# Patient Record
Sex: Female | Born: 1975 | Race: Black or African American | Hispanic: No | Marital: Single | Smoking: Never smoker
Health system: Southern US, Community
[De-identification: ages and names within clinical notes are randomized; demographics above are authoritative.]

## PROBLEM LIST (undated history)

## (undated) DIAGNOSIS — K219 Gastro-esophageal reflux disease without esophagitis: Secondary | ICD-10-CM

## (undated) HISTORY — PX: CHOLECYSTECTOMY: SHX55

## (undated) HISTORY — PX: APPENDECTOMY: SHX54

## (undated) HISTORY — PX: TUBAL LIGATION: SHX77

---

## 2006-04-16 ENCOUNTER — Emergency Department: Payer: Self-pay | Admitting: Emergency Medicine

## 2006-07-22 ENCOUNTER — Emergency Department: Payer: Self-pay | Admitting: Unknown Physician Specialty

## 2007-01-02 ENCOUNTER — Emergency Department: Payer: Self-pay | Admitting: Emergency Medicine

## 2008-10-14 ENCOUNTER — Emergency Department: Payer: Self-pay | Admitting: Emergency Medicine

## 2009-11-13 ENCOUNTER — Emergency Department: Payer: Self-pay | Admitting: Unknown Physician Specialty

## 2009-11-14 ENCOUNTER — Inpatient Hospital Stay: Payer: Self-pay | Admitting: Vascular Surgery

## 2010-09-14 ENCOUNTER — Emergency Department: Payer: Self-pay | Admitting: Emergency Medicine

## 2010-09-27 ENCOUNTER — Emergency Department: Payer: Self-pay | Admitting: Emergency Medicine

## 2011-10-05 ENCOUNTER — Emergency Department: Payer: Self-pay | Admitting: Emergency Medicine

## 2011-12-31 ENCOUNTER — Emergency Department: Payer: Self-pay | Admitting: Unknown Physician Specialty

## 2013-09-22 ENCOUNTER — Emergency Department: Payer: Self-pay | Admitting: Emergency Medicine

## 2013-09-22 LAB — CBC
HCT: 34.2 % — ABNORMAL LOW (ref 35.0–47.0)
HGB: 11.1 g/dL — ABNORMAL LOW (ref 12.0–16.0)
MCH: 26.2 pg (ref 26.0–34.0)
MCHC: 32.6 g/dL (ref 32.0–36.0)
MCV: 80 fL (ref 80–100)
RBC: 4.26 10*6/uL (ref 3.80–5.20)
RDW: 14.5 % (ref 11.5–14.5)
WBC: 7.4 10*3/uL (ref 3.6–11.0)

## 2013-09-22 LAB — COMPREHENSIVE METABOLIC PANEL
Albumin: 3.5 g/dL (ref 3.4–5.0)
Anion Gap: 3 — ABNORMAL LOW (ref 7–16)
BUN: 9 mg/dL (ref 7–18)
Bilirubin,Total: 0.3 mg/dL (ref 0.2–1.0)
Calcium, Total: 9 mg/dL (ref 8.5–10.1)
Co2: 30 mmol/L (ref 21–32)
Creatinine: 0.9 mg/dL (ref 0.60–1.30)
EGFR (African American): >60
Osmolality: 275 (ref 275–301)
SGPT (ALT): 24 U/L (ref 12–78)
Total Protein: 7.6 g/dL (ref 6.4–8.2)

## 2013-09-22 LAB — CK TOTAL AND CKMB (NOT AT ARMC)
CK, Total: 62 U/L (ref 21–215)
CK-MB: <0.5 ng/mL — ABNORMAL LOW (ref 0.5–3.6)

## 2013-09-22 LAB — URINALYSIS, COMPLETE
Bilirubin,UR: NEGATIVE
Glucose,UR: NEGATIVE mg/dL (ref 0–75)
Ketone: NEGATIVE
Specific Gravity: 1.023 (ref 1.003–1.030)

## 2013-09-23 LAB — TROPONIN I: Troponin-I: <0.02 ng/mL

## 2013-09-24 LAB — URINE CULTURE

## 2015-02-24 ENCOUNTER — Emergency Department: Payer: Self-pay | Admitting: Emergency Medicine

## 2016-05-30 ENCOUNTER — Emergency Department: Payer: Self-pay

## 2016-05-30 ENCOUNTER — Emergency Department
Admission: EM | Admit: 2016-05-30 | Discharge: 2016-05-30 | Disposition: A | Discharge status: Home / Self Care | Payer: Self-pay | Attending: Emergency Medicine | Admitting: Emergency Medicine

## 2016-05-30 ENCOUNTER — Encounter: Payer: Self-pay | Admitting: Emergency Medicine

## 2016-05-30 DIAGNOSIS — F4320 Adjustment disorder, unspecified: Secondary | ICD-10-CM | POA: Insufficient documentation

## 2016-05-30 DIAGNOSIS — F4321 Adjustment disorder with depressed mood: Secondary | ICD-10-CM

## 2016-05-30 LAB — CBC
HCT: 31.1 % — ABNORMAL LOW (ref 35.0–47.0)
HEMOGLOBIN: 9.6 g/dL — AB (ref 12.0–16.0)
MCH: 21.9 pg — AB (ref 26.0–34.0)
MCHC: 30.9 g/dL — AB (ref 32.0–36.0)
MCV: 70.9 fL — AB (ref 80.0–100.0)
PLATELETS: 357 10*3/uL (ref 150–440)
RBC: 4.4 MIL/uL (ref 3.80–5.20)
RDW: 20.5 % — ABNORMAL HIGH (ref 11.5–14.5)
WBC: 9.2 10*3/uL (ref 3.6–11.0)

## 2016-05-30 LAB — BASIC METABOLIC PANEL
Anion gap: 8 (ref 5–15)
BUN: 8 mg/dL (ref 6–20)
CALCIUM: 8.9 mg/dL (ref 8.9–10.3)
CHLORIDE: 104 mmol/L (ref 101–111)
CO2: 25 mmol/L (ref 22–32)
CREATININE: 0.76 mg/dL (ref 0.44–1.00)
GFR calc Af Amer: >60 mL/min (ref >60)
GFR calc non Af Amer: >60 mL/min (ref >60)
GLUCOSE: 119 mg/dL — AB (ref 65–99)
Potassium: 3.5 mmol/L (ref 3.5–5.1)
Sodium: 137 mmol/L (ref 135–145)

## 2016-05-30 LAB — TROPONIN I

## 2016-05-30 MED ORDER — LORAZEPAM 1 MG PO TABS
1.0000 mg | ORAL_TABLET | Freq: Once | ORAL | Status: AC
  Administered 2016-05-30: 1 mg via ORAL

## 2016-05-30 MED ORDER — LORAZEPAM 1 MG PO TABS
ORAL_TABLET | ORAL | Status: AC
  Administered 2016-05-30: 1 mg via ORAL
  Filled 2016-05-30: qty 1

## 2016-05-30 MED ORDER — ALPRAZOLAM 0.5 MG PO TABS: 0.5000 mg | ORAL_TABLET | Freq: Three times a day (TID) | ORAL | Status: AC | PRN

## 2016-05-30 NOTE — ED Notes (Signed)
Patient presents to the ED with right sided chest pain and shortness of breath.  Patient was visiting her father in the hospital who has a poor prognosis.  Patient states chest pain seems to be easing somewhat at this time.  Patient reports still feeling somewhat short of breath.  Patient reports high level of stress the past few months and insomnia.

## 2016-05-30 NOTE — Discharge Instructions (Signed)
  Adjustment Disorder Adjustment disorder is an unusually severe reaction to a stressful life event, such as the loss of a job or physical illness. The event may be any stressful event other than the loss of a loved one. Adjustment disorder may affect your feelings, your thinking, how you act, or a combination of these. It may interfere with personal relationships or with the way you are at work, school, or home. People with this disorder are at risk for suicide and substance abuse. They may develop a more serious mental disorder, such as major depressive disorder or post-traumatic stress disorder. SIGNS AND SYMPTOMS  Symptoms may include:  Sadness, depressed mood, or crying spells.  Loss of enjoyment.  Change in appetite or weight.  Sense of loss or hopelessness.  Thoughts of suicide.  Anxiety, worry, or nervousness.  Trouble sleeping.  Avoiding family and friends.  Poor school performance.  Fighting or vandalism.  Reckless driving.  Skipping school.  Poor work performance.  Ignoring bills. Symptoms of adjustment disorder start within 3 months of the stressful life event. They do not last more than 6 months after the event has ended. DIAGNOSIS  To make a diagnosis, your health care provider will ask about what has happened in your life and how it has affected you. He or she may also ask about your medical history and use of medicines, alcohol, and other substances. Your health care provider may do a physical exam and order lab tests or other studies. You may be referred to a mental health specialist for evaluation. TREATMENT  Treatment options include:  Counseling or talk therapy. Talk therapy is usually provided by mental health specialists.  Medicine. Certain medicines may help with depression, anxiety, and sleep.  Support groups. Support groups offer emotional support, advice, and guidance. They are made up of people who have had similar experiences. HOME CARE  INSTRUCTIONS  Keep all follow-up visits as directed by your health care provider. This is important.  Take medicines only as directed by your health care provider. SEEK MEDICAL CARE IF:  Your symptoms get worse.  SEEK IMMEDIATE MEDICAL CARE IF: You have serious thoughts about hurting yourself or someone else. MAKE SURE YOU:  Understand these instructions.  Will watch your condition.  Will get help right away if you are not doing well or get worse.   This information is not intended to replace advice given to you by your health care provider. Make sure you discuss any questions you have with your health care provider.   Document Released: 08/15/2006 Document Revised: 01/01/2015 Document Reviewed: 05/05/2014 Elsevier Interactive Patient Education 2016 [employer]  

## 2016-05-30 NOTE — ED Provider Notes (Signed)
[facility] Emergency Department Provider Note  ____________________________________________   I have reviewed the triage vital signs and the nursing notes.   HISTORY  Chief Complaint Chest Pain    HPI Michele Schneider is a 40 y.o. female whose father unfortunately is the ICU after prolonged emergency room stay requiring CPR 5 times. Patient was very anxious and immediately after hearing this news she became very upset. She states that she does not feel she is having any sort of medical problem today she feels that she was just anxious she would like to leave for her nerves. She denies any ongoing symptoms. She denies actually having chest pain she states "I was just overwhelmed". She denies any HI or SI, she does not feel that she is a danger to self or others. Patient has had no pleuritic pain no cough and at this time she denies any shortness of breath that she calms down. This all happened immediately after hearing the news of her father's critical status. She has no known risk factors she tells me for PE or DVT, no pleuritic chest pain, she denies any other medical problems.   History reviewed. No pertinent past medical history.  There are no active problems to display for this patient.   History reviewed. No pertinent past surgical history.  No current outpatient prescriptions on file.  Allergies Review of patient's allergies indicates no known allergies.  No family history on file.  Social History Social History  Substance Use Topics  . Smoking status: Never Smoker   . Smokeless tobacco: None  . Alcohol Use: Yes     Comment: social    Review of Systems Constitutional: No fever/chills Eyes: No visual changes. ENT: No sore throat. No stiff neck no neck pain Cardiovascular:See history of present illness regarding pain. Respiratory: Denies shortness of breath. Gastrointestinal:   no vomiting.  No diarrhea.  No constipation. Genitourinary:  Negative for dysuria. Musculoskeletal: Negative lower extremity swelling Skin: Negative for rash. Neurological: Negative for headaches, focal weakness or numbness. 10-point ROS otherwise negative.  ____________________________________________   PHYSICAL EXAM:  VITAL SIGNS: ED Triage Vitals  Enc Vitals Group     BP 05/30/16 1154 148/83 mmHg     Pulse Rate 05/30/16 1154 90     Resp 05/30/16 1154 16     Temp 05/30/16 1154 98.8 F (37.1 C)     Temp Source 05/30/16 1154 Oral     SpO2 05/30/16 1154 100 %     Weight 05/30/16 1154 270 lb (122.471 kg)     Height 05/30/16 1154 5\' 5"  (1.651 m)     Head Cir --      Peak Flow --      Pain Score 05/30/16 1154 0     Pain Loc --      Pain Edu? --      Excl. in GC? --     Constitutional: Alert and oriented. Well appearing and in no acute distress.Somewhat tearful but otherwise in no acute distress Eyes: Conjunctivae are normal. PERRL. EOMI. Head: Atraumatic. Nose: No congestion/rhinnorhea. Mouth/Throat: Mucous membranes are moist.  Oropharynx non-erythematous. Neck: No stridor.   Nontender with no meningismus Cardiovascular: Normal rate, regular rhythm. Grossly normal heart sounds.  Good peripheral circulation. Respiratory: Normal respiratory effort.  No retractions. Lungs CTAB. Abdominal: Soft and nontender. No distention. No guarding no rebound Back:  There is no focal tenderness or step off there is no midline tenderness there are no lesions noted. there is no  CVA tenderness Musculoskeletal: No lower extremity tenderness. No joint effusions, no DVT signs strong distal pulses no edema Neurologic:  Normal speech and language. No gross focal neurologic deficits are appreciated.  Skin:  Skin is warm, dry and intact. No rash noted. Psychiatric: Mood and affect are consistent with grief reaction. Speech and behavior are normal.  ____________________________________________   LABS (all labs ordered are listed, but only abnormal results  are displayed)  Labs Reviewed  BASIC METABOLIC PANEL - Abnormal; Notable for the following:    Glucose, Bld 119 (*)    All other components within normal limits  CBC - Abnormal; Notable for the following:    Hemoglobin 9.6 (*)    HCT 31.1 (*)    MCV 70.9 (*)    MCH 21.9 (*)    MCHC 30.9 (*)    RDW 20.5 (*)    All other components within normal limits  TROPONIN I   ____________________________________________  EKG  I personally interpreted any EKGs ordered by me or triage Normal sinus rhythm at 83 beats per an acute ST elevation or acute ST depression normal axis unremarkable EKG ____________________________________________  RADIOLOGY  I reviewed any imaging ordered by me or triage that were performed during my shift and, if possible, patient and/or family made aware of any abnormal findings. ____________________________________________   PROCEDURES  Procedure(s) performed: None  Critical Care performed: None  ____________________________________________   INITIAL IMPRESSION / ASSESSMENT AND PLAN / ED COURSE  Pertinent labs & imaging results that were available during my care of the patient were reviewed by me and considered in my medical decision making (see chart for details).  Patient at this time has no symptoms and no complaints. She is refusing further care she would like to go to the ICU to be with her father. She understands limitation this placed upon me in terms of workup. Specifically I cannot do serial cardiac enzymes however again I have low suspicion. At this time there is no evidence of PE or ACS or dissection. Given the patient is refusing further care, and has a story very inconsistent with any significant pathology we will discharge her at her request. She understands she must return to the emergency room for any new or worrisome symptoms. She is made aware of all the findings we have had here including her relative anemia which she states is chronic. We  will have her follow-up as an outpatient with her primary care doctor. There is no evidence of takusubo.   ____________________________________________   FINAL CLINICAL IMPRESSION(S) / ED DIAGNOSES  Final diagnoses:  None      This chart was dictated using voice recognition software.  Despite best efforts to proofread,  errors can occur which can change meaning.     Jeanmarie Plant, MD 05/30/16 1321

## 2017-03-12 DIAGNOSIS — Y92410 Unspecified street and highway as the place of occurrence of the external cause: Secondary | ICD-10-CM | POA: Insufficient documentation

## 2017-03-12 DIAGNOSIS — S8991XA Unspecified injury of right lower leg, initial encounter: Secondary | ICD-10-CM | POA: Diagnosis present

## 2017-03-12 DIAGNOSIS — Z5321 Procedure and treatment not carried out due to patient leaving prior to being seen by health care provider: Secondary | ICD-10-CM | POA: Insufficient documentation

## 2017-03-12 DIAGNOSIS — M79661 Pain in right lower leg: Secondary | ICD-10-CM | POA: Diagnosis not present

## 2017-03-12 DIAGNOSIS — Y999 Unspecified external cause status: Secondary | ICD-10-CM | POA: Insufficient documentation

## 2017-03-12 DIAGNOSIS — Y939 Activity, unspecified: Secondary | ICD-10-CM | POA: Insufficient documentation

## 2017-03-12 DIAGNOSIS — M542 Cervicalgia: Secondary | ICD-10-CM | POA: Diagnosis not present
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
## 2017-03-13 ENCOUNTER — Emergency Department
Admission: EM | Admit: 2017-03-13 | Discharge: 2017-03-13 | Disposition: A | Discharge status: Home / Self Care | Payer: No Typology Code available for payment source | Attending: Emergency Medicine | Admitting: Emergency Medicine

## 2018-09-10 ENCOUNTER — Other Ambulatory Visit: Payer: Self-pay

## 2018-09-10 ENCOUNTER — Emergency Department
Admission: EM | Admit: 2018-09-10 | Discharge: 2018-09-10 | Disposition: A | Discharge status: Home / Self Care | Payer: Medicaid Other | Attending: Student in an Organized Health Care Education/Training Program | Admitting: Student in an Organized Health Care Education/Training Program

## 2018-09-10 ENCOUNTER — Emergency Department: Payer: Medicaid Other

## 2018-09-10 ENCOUNTER — Encounter: Payer: Self-pay | Admitting: Radiology

## 2018-09-10 DIAGNOSIS — K5732 Diverticulitis of large intestine without perforation or abscess without bleeding: Secondary | ICD-10-CM | POA: Diagnosis not present

## 2018-09-10 DIAGNOSIS — R1084 Generalized abdominal pain: Secondary | ICD-10-CM | POA: Diagnosis present

## 2018-09-10 LAB — CBC
HCT: 36 % (ref 35.0–47.0)
HEMOGLOBIN: 11.6 g/dL — AB (ref 12.0–16.0)
MCH: 25.2 pg — ABNORMAL LOW (ref 26.0–34.0)
MCHC: 32.2 g/dL (ref 32.0–36.0)
MCV: 78.3 fL — AB (ref 80.0–100.0)
PLATELETS: 322 10*3/uL (ref 150–440)
RBC: 4.59 MIL/uL (ref 3.80–5.20)
RDW: 16.8 % — ABNORMAL HIGH (ref 11.5–14.5)
WBC: 9 10*3/uL (ref 3.6–11.0)

## 2018-09-10 LAB — COMPREHENSIVE METABOLIC PANEL
ALT: 18 U/L (ref 0–44)
AST: 19 U/L (ref 15–41)
Albumin: 3.9 g/dL (ref 3.5–5.0)
Alkaline Phosphatase: 93 U/L (ref 38–126)
Anion gap: 6 (ref 5–15)
BUN: 9 mg/dL (ref 6–20)
CHLORIDE: 105 mmol/L (ref 98–111)
CO2: 27 mmol/L (ref 22–32)
CREATININE: 0.85 mg/dL (ref 0.44–1.00)
Calcium: 8.6 mg/dL — ABNORMAL LOW (ref 8.9–10.3)
GFR calc Af Amer: >60 mL/min (ref >60)
Glucose, Bld: 120 mg/dL — ABNORMAL HIGH (ref 70–99)
Potassium: 3.9 mmol/L (ref 3.5–5.1)
Sodium: 138 mmol/L (ref 135–145)
Total Bilirubin: 0.6 mg/dL (ref 0.3–1.2)
Total Protein: 7.3 g/dL (ref 6.5–8.1)

## 2018-09-10 LAB — LIPASE, BLOOD: Lipase: 26 U/L (ref 11–51)

## 2018-09-10 MED ORDER — HYDROCODONE-ACETAMINOPHEN 5-325 MG PO TABS
1.0000 | ORAL_TABLET | Freq: Once | ORAL | Status: AC
  Administered 2018-09-10: 1 via ORAL
  Filled 2018-09-10: qty 1

## 2018-09-10 MED ORDER — METRONIDAZOLE 500 MG PO TABS
500.0000 mg | ORAL_TABLET | Freq: Once | ORAL | Status: AC
  Administered 2018-09-10: 500 mg via ORAL
  Filled 2018-09-10: qty 1

## 2018-09-10 MED ORDER — HYDROCODONE-ACETAMINOPHEN 5-325 MG PO TABS: 1.0000 | ORAL_TABLET | ORAL | 0 refills | Status: DC | PRN

## 2018-09-10 MED ORDER — IOPAMIDOL (ISOVUE-300) INJECTION 61%
125.0000 mL | Freq: Once | INTRAVENOUS | Status: AC | PRN
  Administered 2018-09-10: 125 mL via INTRAVENOUS

## 2018-09-10 MED ORDER — PROMETHAZINE HCL 25 MG/ML IJ SOLN: 12.5000 mg | Freq: Four times a day (QID) | INTRAMUSCULAR | Status: DC | PRN

## 2018-09-10 MED ORDER — CIPROFLOXACIN HCL 500 MG PO TABS
500.0000 mg | ORAL_TABLET | Freq: Once | ORAL | Status: AC
  Administered 2018-09-10: 500 mg via ORAL
  Filled 2018-09-10: qty 1

## 2018-09-10 MED ORDER — MORPHINE SULFATE (PF) 4 MG/ML IV SOLN
4.0000 mg | INTRAVENOUS | Status: DC | PRN
  Administered 2018-09-10: 4 mg via INTRAVENOUS
  Filled 2018-09-10: qty 1

## 2018-09-10 MED ORDER — ONDANSETRON HCL 4 MG PO TABS: 4.0000 mg | ORAL_TABLET | Freq: Every day | ORAL | 0 refills | Status: AC | PRN

## 2018-09-10 MED ORDER — SODIUM CHLORIDE 0.9 % IV BOLUS
1000.0000 mL | Freq: Once | INTRAVENOUS | Status: AC
  Administered 2018-09-10: 1000 mL via INTRAVENOUS

## 2018-09-10 MED ORDER — METRONIDAZOLE 500 MG PO TABS: 500.0000 mg | ORAL_TABLET | Freq: Three times a day (TID) | ORAL | 0 refills | Status: AC

## 2018-09-10 MED ORDER — CIPROFLOXACIN HCL 500 MG PO TABS: 500.0000 mg | ORAL_TABLET | Freq: Two times a day (BID) | ORAL | 0 refills | Status: AC

## 2018-09-10 NOTE — Discharge Instructions (Addendum)

## 2018-09-10 NOTE — ED Notes (Signed)
Pt went to CT

## 2018-09-10 NOTE — ED Triage Notes (Signed)
Patient reports having mid lower abdominal pain for the past 2 days.  Denies nausea or vomiting.

## 2018-09-10 NOTE — ED Provider Notes (Signed)
[facility] Emergency Department Provider Note    First MD Initiated Contact with Patient 09/10/18 0109     (approximate)  I have reviewed the triage vital signs and the nursing notes.   HISTORY  Chief Complaint Abdominal Pain    HPI Michele Schneider is a 42 y.o. female presents to the ER with chief complaint of mid left-sided lower abdominal pain over the past 2 days.  No nausea or vomiting but did have watery diarrhea today.  Is never had pain like this before.  She status post appendectomy as well as cholecystectomy.  Denies any chest pain or shortness of breath.  Pain she describes as mild to moderate in severity.    History reviewed. No pertinent past medical history. No family history on file. No past surgical history on file. There are no active problems to display for this patient.     Prior to Admission medications   Medication Sig Start Date End Date Taking? Authorizing Provider  ciprofloxacin (CIPRO) 500 MG tablet Take 1 tablet (500 mg total) by mouth 2 (two) times daily for 7 days. 09/10/18 09/17/18  Willy Eddy, MD  HYDROcodone-acetaminophen (NORCO) 5-325 MG tablet Take 1 tablet by mouth every 4 (four) hours as needed for moderate pain. 09/10/18   Willy Eddy, MD  metroNIDAZOLE (FLAGYL) 500 MG tablet Take 1 tablet (500 mg total) by mouth 3 (three) times daily for 7 days. 09/10/18 09/17/18  Willy Eddy, MD  ondansetron [facility]) 4 MG tablet Take 1 tablet (4 mg total) by mouth daily as needed for nausea or vomiting. 09/10/18 09/10/19  Willy Eddy, MD    Allergies Penicillins    Social History Social History   Tobacco Use  . Smoking status: Never Smoker  Substance Use Topics  . Alcohol use: Yes    Comment: social  . Drug use: Not on file    Review of Systems Patient denies headaches, rhinorrhea, blurry vision, numbness, shortness of breath, chest pain, edema, cough, abdominal pain, nausea, vomiting, diarrhea,  dysuria, fevers, rashes or hallucinations unless otherwise stated above in HPI. ____________________________________________   PHYSICAL EXAM:  VITAL SIGNS: Vitals:   09/10/18 0300 09/10/18 0538  BP: 134/79 125/72  Pulse: 83 79  Resp: 18 16  Temp:    SpO2: 97% 98%    Constitutional: Alert and oriented.  Eyes: Conjunctivae are normal.  Head: Atraumatic. Nose: No congestion/rhinnorhea. Mouth/Throat: Mucous membranes are moist.   Neck: No stridor. Painless ROM.  Cardiovascular: Normal rate, regular rhythm. Grossly normal heart sounds.  Good peripheral circulation. Respiratory: Normal respiratory effort.  No retractions. Lungs CTAB. Gastrointestinal: Soft ttp in llq No distention. No abdominal bruits. No CVA tenderness. Genitourinary:  Musculoskeletal: No lower extremity tenderness nor edema.  No joint effusions. Neurologic:  Normal speech and language. No gross focal neurologic deficits are appreciated. No facial droop Skin:  Skin is warm, dry and intact. No rash noted. Psychiatric: Mood and affect are normal. Speech and behavior are normal.  ____________________________________________   LABS (all labs ordered are listed, but only abnormal results are displayed)  Results for orders placed or performed during the hospital encounter of 09/10/18 (from the past 24 hour(s))  CBC     Status: Abnormal   Collection Time: 09/10/18  1:15 AM  Result Value Ref Range   WBC 9.0 3.6 - 11.0 K/uL   RBC 4.59 3.80 - 5.20 MIL/uL   Hemoglobin 11.6 (L) 12.0 - 16.0 g/dL   HCT 16.1 09.6 - 04.5 %  MCV 78.3 (L) 80.0 - 100.0 fL   MCH 25.2 (L) 26.0 - 34.0 pg   MCHC 32.2 32.0 - 36.0 g/dL   RDW 11.9 (H) 14.7 - 82.9 %   Platelets 322 150 - 440 K/uL  Comprehensive metabolic panel     Status: Abnormal   Collection Time: 09/10/18  1:47 AM  Result Value Ref Range   Sodium 138 135 - 145 mmol/L   Potassium 3.9 3.5 - 5.1 mmol/L   Chloride 105 98 - 111 mmol/L   CO2 27 22 - 32 mmol/L   Glucose, Bld 120  (H) 70 - 99 mg/dL   BUN 9 6 - 20 mg/dL   Creatinine, Ser 5.62 0.44 - 1.00 mg/dL   Calcium 8.6 (L) 8.9 - 10.3 mg/dL   Total Protein 7.3 6.5 - 8.1 g/dL   Albumin 3.9 3.5 - 5.0 g/dL   AST 19 15 - 41 U/L   ALT 18 0 - 44 U/L   Alkaline Phosphatase 93 38 - 126 U/L   Total Bilirubin 0.6 0.3 - 1.2 mg/dL   GFR calc non Af Amer >60 >60 mL/min   GFR calc Af Amer >60 >60 mL/min   Anion gap 6 5 - 15  Lipase, blood     Status: None   Collection Time: 09/10/18  1:47 AM  Result Value Ref Range   Lipase 26 11 - 51 U/L   ____________________________________________ ____________________________________________  RADIOLOGY  I personally reviewed all radiographic images ordered to evaluate for the above acute complaints and reviewed radiology reports and findings.  These findings were personally discussed with the patient.  Please see medical record for radiology report.  ____________________________________________   PROCEDURES  Procedure(s) performed:  Procedures    Critical Care performed: no ____________________________________________   INITIAL IMPRESSION / ASSESSMENT AND PLAN / ED COURSE  Pertinent labs & imaging results that were available during my care of the patient were reviewed by me and considered in my medical decision making (see chart for details).   DDX: Diverticulitis, perforation, abscess, musculoskeletal strain, stone, UTI  Michele Schneider is a 42 y.o. who presents to the ED with symptoms as described above.  She is afebrile and hemodynamically stable but given her acuity of symptoms and pain will order CT imaging to further evaluate.  Blood work is fortunately fairly reassuring.  We will give IV fluids as well as IV pain medication and IV antiemetic.  Clinical Course as of Sep 10 646  Tue Sep 10, 2018  0404 Patient does have evidence of acute sigmoid diverticulitis without abscess or perforation.  Will give antibiotics as well as oral pain medication.   [PR]  [phone]  Patient was able to tolerate PO and was able to ambulate with a steady gait.  Have discussed with the patient and available family all diagnostics and treatments performed thus far and all questions were answered to the best of my ability. The patient demonstrates understanding and agreement with plan.     [PR]    Clinical Course User Index [PR] Willy Eddy, MD     As part of my medical decision making, I reviewed the following data within the electronic MEDICAL RECORD NUMBER Nursing notes reviewed and incorporated, Labs reviewed, notes from prior ED visits.   ____________________________________________   FINAL CLINICAL IMPRESSION(S) / ED DIAGNOSES  Final diagnoses:  Diverticulitis of large intestine without perforation or abscess without bleeding      NEW MEDICATIONS STARTED DURING THIS VISIT:  Discharge Medication List as  of 09/10/2018  5:29 AM    START taking these medications   Details  ciprofloxacin (CIPRO) 500 MG tablet Take 1 tablet (500 mg total) by mouth 2 (two) times daily for 7 days., Starting Tue 09/10/2018, Until Tue 09/17/2018, Normal    HYDROcodone-acetaminophen (NORCO) 5-325 MG tablet Take 1 tablet by mouth every 4 (four) hours as needed for moderate pain., Starting Tue 09/10/2018, Normal    metroNIDAZOLE (FLAGYL) 500 MG tablet Take 1 tablet (500 mg total) by mouth 3 (three) times daily for 7 days., Starting Tue 09/10/2018, Until Tue 09/17/2018, Normal    ondansetron (ZOFRAN) 4 MG tablet Take 1 tablet (4 mg total) by mouth daily as needed for nausea or vomiting., Starting Tue 09/10/2018, Until Wed 09/10/2019, Print         Note:  This document was prepared using Dragon voice recognition software and may include unintentional dictation errors.    Willy Eddyobinson, Inanna Telford, MD 09/10/18 [phone]

## 2018-09-19 ENCOUNTER — Emergency Department
Admission: EM | Admit: 2018-09-19 | Discharge: 2018-09-19 | Disposition: A | Discharge status: Home / Self Care | Payer: Medicaid Other | Attending: Emergency Medicine | Admitting: Emergency Medicine

## 2018-09-19 ENCOUNTER — Other Ambulatory Visit: Payer: Self-pay

## 2018-09-19 ENCOUNTER — Emergency Department: Payer: Medicaid Other

## 2018-09-19 ENCOUNTER — Encounter: Payer: Self-pay | Admitting: Emergency Medicine

## 2018-09-19 DIAGNOSIS — Y939 Activity, unspecified: Secondary | ICD-10-CM | POA: Insufficient documentation

## 2018-09-19 DIAGNOSIS — M79604 Pain in right leg: Secondary | ICD-10-CM | POA: Diagnosis present

## 2018-09-19 DIAGNOSIS — M7071 Other bursitis of hip, right hip: Secondary | ICD-10-CM | POA: Diagnosis not present

## 2018-09-19 LAB — COMPREHENSIVE METABOLIC PANEL
ALT: 28 U/L (ref 0–44)
AST: 28 U/L (ref 15–41)
Albumin: 3.9 g/dL (ref 3.5–5.0)
Alkaline Phosphatase: 87 U/L (ref 38–126)
Anion gap: 8 (ref 5–15)
BILIRUBIN TOTAL: 0.7 mg/dL (ref 0.3–1.2)
BUN: 9 mg/dL (ref 6–20)
CO2: 26 mmol/L (ref 22–32)
Calcium: 8.8 mg/dL — ABNORMAL LOW (ref 8.9–10.3)
Chloride: 107 mmol/L (ref 98–111)
Creatinine, Ser: 0.82 mg/dL (ref 0.44–1.00)
GFR calc Af Amer: >60 mL/min (ref >60)
Glucose, Bld: 127 mg/dL — ABNORMAL HIGH (ref 70–99)
POTASSIUM: 3.6 mmol/L (ref 3.5–5.1)
Sodium: 141 mmol/L (ref 135–145)
TOTAL PROTEIN: 7.2 g/dL (ref 6.5–8.1)

## 2018-09-19 LAB — CBC
HEMATOCRIT: 32.4 % — AB (ref 35.0–47.0)
Hemoglobin: 10.8 g/dL — ABNORMAL LOW (ref 12.0–16.0)
MCH: 25.7 pg — ABNORMAL LOW (ref 26.0–34.0)
MCHC: 33.4 g/dL (ref 32.0–36.0)
MCV: 77.1 fL — AB (ref 80.0–100.0)
Platelets: 307 10*3/uL (ref 150–440)
RBC: 4.2 MIL/uL (ref 3.80–5.20)
RDW: 16.5 % — AB (ref 11.5–14.5)
WBC: 10.6 10*3/uL (ref 3.6–11.0)

## 2018-09-19 LAB — SEDIMENTATION RATE: Sed Rate: 33 mm/hr — ABNORMAL HIGH (ref 0–20)

## 2018-09-19 LAB — C-REACTIVE PROTEIN: CRP: 1.9 mg/dL — AB (ref <1.0)

## 2018-09-19 MED ORDER — PREDNISONE 20 MG PO TABS
60.0000 mg | ORAL_TABLET | Freq: Once | ORAL | Status: AC
  Administered 2018-09-19: 60 mg via ORAL
  Filled 2018-09-19: qty 3

## 2018-09-19 MED ORDER — OXYCODONE-ACETAMINOPHEN 5-325 MG PO TABS
2.0000 | ORAL_TABLET | Freq: Once | ORAL | Status: AC
  Administered 2018-09-19: 2 via ORAL
  Filled 2018-09-19: qty 2

## 2018-09-19 MED ORDER — MORPHINE SULFATE (PF) 4 MG/ML IV SOLN
4.0000 mg | Freq: Once | INTRAVENOUS | Status: AC
  Administered 2018-09-19: 4 mg via INTRAVENOUS

## 2018-09-19 MED ORDER — KETOROLAC TROMETHAMINE 30 MG/ML IJ SOLN
INTRAMUSCULAR | Status: AC
  Filled 2018-09-19: qty 1

## 2018-09-19 MED ORDER — ONDANSETRON HCL 4 MG/2ML IJ SOLN
4.0000 mg | Freq: Once | INTRAMUSCULAR | Status: AC
  Administered 2018-09-19: 4 mg via INTRAVENOUS

## 2018-09-19 MED ORDER — KETOROLAC TROMETHAMINE 30 MG/ML IJ SOLN
30.0000 mg | Freq: Once | INTRAMUSCULAR | Status: AC
  Administered 2018-09-19: 30 mg via INTRAVENOUS
  Filled 2018-09-19: qty 1

## 2018-09-19 MED ORDER — PREDNISONE 10 MG (21) PO TBPK: ORAL_TABLET | ORAL | 0 refills | Status: DC

## 2018-09-19 MED ORDER — OXYCODONE-ACETAMINOPHEN 5-325 MG PO TABS: 1.0000 | ORAL_TABLET | Freq: Three times a day (TID) | ORAL | 0 refills | Status: DC | PRN

## 2018-09-19 MED ORDER — ONDANSETRON HCL 4 MG/2ML IJ SOLN
INTRAMUSCULAR | Status: AC
  Administered 2018-09-19: 4 mg via INTRAVENOUS
  Filled 2018-09-19: qty 2

## 2018-09-19 MED ORDER — MORPHINE SULFATE (PF) 4 MG/ML IV SOLN
INTRAVENOUS | Status: AC
  Filled 2018-09-19: qty 1

## 2018-09-19 NOTE — ED Notes (Signed)
PT not in room when this RN went to give Toradol

## 2018-09-19 NOTE — ED Triage Notes (Signed)
Pt to triage via w/c with no distress noted, brought in by EMS; pt reports pain to right hip/thigh since yesterday with no known injury; denies hx of same

## 2018-09-19 NOTE — ED Provider Notes (Signed)
IMPRESSION: Inflammation at the tensor fascia lata/proximal IT band at the level of the greater trochanter, likely with early bursitis.  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  Right thigh venous varicosities.  MRI with bursitis or a superficial cause for her pain.  No signs of septic arthritis or osteomyelitis.  She will be discharged with pain medicine and steroids with a work note.  She is cleared for outpatient orthopedic follow-up.   Emily Filbert, MD 09/19/18 [phone]

## 2018-09-19 NOTE — ED Notes (Signed)
Pt ambulated to bathroom with slow steady gait, pt c/o severe pain to RT hip with ambulation

## 2018-09-19 NOTE — ED Provider Notes (Signed)
[facility] Emergency Department Provider Note   First MD Initiated Contact with Patient 09/19/18 0421     (approximate)  I have reviewed the triage vital signs and the nursing notes.   HISTORY  Chief Complaint Leg Pain    HPI Michele Schneider is a 42 y.o. female presents to the emergency department with 10 out of 10 nontraumatic right hip pain which patient did started after leaving work [employer].  Patient denies any fever no lower extreme any weakness or numbness.    Past medical history Recent diagnosis of diverticulitis currently taking antibiotics   Surgical history Appendectomy Cholecystectomy There are no active problems to display for this patient.    Prior to Admission medications   Medication Sig Start Date End Date Taking? Authorizing Provider  HYDROcodone-acetaminophen (NORCO) 5-325 MG tablet Take 1 tablet by mouth every 4 (four) hours as needed for moderate pain. 09/10/18   Willy Eddy, MD  ondansetron (ZOFRAN) 4 MG tablet Take 1 tablet (4 mg total) by mouth daily as needed for nausea or vomiting. 09/10/18 09/10/19  Willy Eddy, MD  oxyCODONE-acetaminophen (PERCOCET) 5-325 MG tablet Take 1 tablet by mouth every 8 (eight) hours as needed. 09/19/18   Emily Filbert, MD  predniSONE (STERAPRED UNI-PAK 21 TAB) 10 MG (21) TBPK tablet Dispense taper pack as directed 09/19/18   Emily Filbert, MD    Allergies Penicillins  No family history on file.  Social History Social History   Tobacco Use  . Smoking status: Never Smoker  . Smokeless tobacco: Never Used  Substance Use Topics  . Alcohol use: Yes    Comment: social  . Drug use: Not on file    Review of Systems Constitutional: No fever/chills Eyes: No visual changes. ENT: No sore throat. Cardiovascular: Denies chest pain. Respiratory: Denies shortness of breath. Gastrointestinal: No abdominal pain.  No nausea, no vomiting.  No diarrhea.  No  constipation. Genitourinary: Negative for dysuria. Musculoskeletal: Negative for neck pain.  Negative for back pain.  Positive for right hip pain Integumentary: Negative for rash. Neurological: Negative for headaches, focal weakness or numbness.  ____________________________________________   PHYSICAL EXAM:  VITAL SIGNS: ED Triage Vitals  Enc Vitals Group     BP 09/19/18 0340 (!) 110/48     Pulse Rate 09/19/18 0340 72     Resp 09/19/18 0340 18     Temp 09/19/18 0340 98.1 F (36.7 C)     Temp Source 09/19/18 0340 Oral     SpO2 09/19/18 0340 100 %     Weight 09/19/18 0341 136.1 kg (300 lb)     Height 09/19/18 0341 1.651 m (5\' 5" )     Head Circumference --      Peak Flow --      Pain Score 09/19/18 0341 10     Pain Loc --      Pain Edu? --      Excl. in GC? --     Constitutional: Alert and oriented.  Apparent discomfort  eyes: Conjunctivae are normal.  Head: Atraumatic. Mouth/Throat: Mucous membranes are moist. Neck: No stridor.   Cardiovascular: Normal rate, regular rhythm. Good peripheral circulation. Grossly normal heart sounds. Respiratory: Normal respiratory effort.  No retractions. Lungs CTAB. Gastrointestinal: Soft and nontender. No distention.  Musculoskeletal: Right hip pain with palpation.  Pain with active and passive range of motion of the right hip. Neurologic:  Normal speech and language. No gross focal neurologic deficits are appreciated.  Skin:  Skin is  warm, dry and intact. No rash noted. Psychiatric: Mood and affect are normal. Speech and behavior are normal.  ____________________________________________   LABS (all labs ordered are listed, but only abnormal results are displayed)  Labs Reviewed  CBC - Abnormal; Notable for the following components:      Result Value   Hemoglobin 10.8 (*)    HCT 32.4 (*)    MCV 77.1 (*)    MCH 25.7 (*)    RDW 16.5 (*)    All other components within normal limits  COMPREHENSIVE METABOLIC PANEL - Abnormal;  Notable for the following components:   Glucose, Bld 127 (*)    Calcium 8.8 (*)    All other components within normal limits  C-REACTIVE PROTEIN - Abnormal; Notable for the following components:   CRP 1.9 (*)    All other components within normal limits  SEDIMENTATION RATE - Abnormal; Notable for the following components:   Sed Rate 33 (*)    All other components within normal limits   _________________  RADIOLOGY I, Remington N Reginal Wojcicki, personally viewed and evaluated these images (plain radiographs) as part of my medical decision making, as well as reviewing the written report by the radiologist.   dislocation.  Official radiology report(s): No results found.    Procedures   ____________________________________________   INITIAL IMPRESSION / ASSESSMENT AND PLAN / ED COURSE  As part of my medical decision making, I reviewed the following data within the electronic MEDICAL RECORD NUMBER   42 year old female with above-stated history and physical exam secondary to nontraumatic right hip pain.  Patient given IV morphine in the emergency department with pain improvement.  Concern for possible bursitis versus other potential musculoskeletal etiology for the patient's pain.  X-ray performed which revealed no acute pathology.  MRI pending at this time ____________________________________________  FINAL CLINICAL IMPRESSION(S) / ED DIAGNOSES  Final diagnoses:  Bursitis of right hip, unspecified bursa     MEDICATIONS GIVEN DURING THIS VISIT:  Medications  morphine 4 MG/ML injection 4 mg (4 mg Intravenous Given 09/19/18 0515)  ondansetron (ZOFRAN) injection 4 mg (4 mg Intravenous Given 09/19/18 0516)  ketorolac (TORADOL) 30 MG/ML injection 30 mg (30 mg Intravenous Given 09/19/18 0707)  predniSONE (DELTASONE) tablet 60 mg (60 mg Oral Given 09/19/18 0835)  oxyCODONE-acetaminophen (PERCOCET/ROXICET) 5-325 MG per tablet 2 tablet (2 tablets Oral Given 09/19/18 0835)     ED Discharge Orders          Ordered    predniSONE (STERAPRED UNI-PAK 21 TAB) 10 MG (21) TBPK tablet     09/19/18 0824    oxyCODONE-acetaminophen (PERCOCET) 5-325 MG tablet  Every 8 hours PRN     09/19/18 0824           Note:  This document was prepared using Dragon voice recognition software and may include unintentional dictation errors.    Darci Current, MD 09/20/18 [phone]

## 2019-07-04 ENCOUNTER — Telehealth: Payer: Self-pay | Admitting: General Practice

## 2019-07-04 NOTE — Telephone Encounter (Signed)
Pt called at number listed in chat but the person that answered the phone stated that it was the wrong number for the pt. Will need to contact the health department to obtain the correct number for the pt.

## 2019-07-04 NOTE — Telephone Encounter (Signed)
Copied from [place] [id number]. Topic: General - Other >> Jul 04, 2019  4:44 PM Parke Poisson wrote: Reason for CRM: Michele Schneider from health department called requesting that pt be tested fo covid.His call back number is [phone]

## 2019-07-07 NOTE — Telephone Encounter (Signed)
Pt called but no answer. Left message for pt to return call to schedule testing.

## 2019-07-07 NOTE — Telephone Encounter (Signed)
Attempted to reach pt, no answer. Left vm to call back  

## 2019-07-07 NOTE — Telephone Encounter (Signed)
Call to health dept- [phone] [place]

## 2019-07-07 NOTE — Telephone Encounter (Signed)
Call to patient - left message for her to call back to schedule testing.

## 2020-10-14 ENCOUNTER — Emergency Department
Admission: EM | Admit: 2020-10-14 | Discharge: 2020-10-14 | Disposition: A | Discharge status: Home / Self Care | Payer: Medicaid Other | Attending: Emergency Medicine | Admitting: Emergency Medicine

## 2020-10-14 ENCOUNTER — Other Ambulatory Visit: Payer: Self-pay

## 2020-10-14 ENCOUNTER — Emergency Department: Payer: Medicaid Other

## 2020-10-14 ENCOUNTER — Encounter: Payer: Self-pay | Admitting: *Deleted

## 2020-10-14 DIAGNOSIS — Z20822 Contact with and (suspected) exposure to covid-19: Secondary | ICD-10-CM | POA: Diagnosis not present

## 2020-10-14 DIAGNOSIS — J069 Acute upper respiratory infection, unspecified: Secondary | ICD-10-CM | POA: Insufficient documentation

## 2020-10-14 DIAGNOSIS — R0602 Shortness of breath: Secondary | ICD-10-CM | POA: Diagnosis present

## 2020-10-14 HISTORY — DX: Gastro-esophageal reflux disease without esophagitis: K21.9

## 2020-10-14 LAB — CBC WITH DIFFERENTIAL/PLATELET
Abs Immature Granulocytes: 0.03 10*3/uL (ref 0.00–0.07)
Basophils Absolute: 0.1 10*3/uL (ref 0.0–0.1)
Basophils Relative: 1 %
Eosinophils Absolute: 0.1 10*3/uL (ref 0.0–0.5)
Eosinophils Relative: 2 %
HCT: 33.2 % — ABNORMAL LOW (ref 36.0–46.0)
Hemoglobin: 10 g/dL — ABNORMAL LOW (ref 12.0–15.0)
Immature Granulocytes: 0 %
Lymphocytes Relative: 17 %
Lymphs Abs: 1.6 10*3/uL (ref 0.7–4.0)
MCH: 22.5 pg — ABNORMAL LOW (ref 26.0–34.0)
MCHC: 30.1 g/dL (ref 30.0–36.0)
MCV: 74.6 fL — ABNORMAL LOW (ref 80.0–100.0)
Monocytes Absolute: 0.4 10*3/uL (ref 0.1–1.0)
Monocytes Relative: 4 %
Neutro Abs: 7.1 10*3/uL (ref 1.7–7.7)
Neutrophils Relative %: 76 %
Platelets: 384 10*3/uL (ref 150–400)
RBC: 4.45 MIL/uL (ref 3.87–5.11)
RDW: 17.9 % — ABNORMAL HIGH (ref 11.5–15.5)
WBC: 9.2 10*3/uL (ref 4.0–10.5)
nRBC: 0 % (ref 0.0–0.2)

## 2020-10-14 LAB — URINALYSIS, COMPLETE (UACMP) WITH MICROSCOPIC
Bacteria, UA: NONE SEEN
Bilirubin Urine: NEGATIVE
Glucose, UA: NEGATIVE mg/dL
Hgb urine dipstick: NEGATIVE
Ketones, ur: NEGATIVE mg/dL
Leukocytes,Ua: NEGATIVE
Nitrite: NEGATIVE
Protein, ur: NEGATIVE mg/dL
Specific Gravity, Urine: 1.017 (ref 1.005–1.030)
pH: 5 (ref 5.0–8.0)

## 2020-10-14 LAB — RESPIRATORY PANEL BY RT PCR (FLU A&B, COVID)
Influenza A by PCR: NEGATIVE
Influenza B by PCR: NEGATIVE
SARS Coronavirus 2 by RT PCR: NEGATIVE

## 2020-10-14 LAB — TROPONIN I (HIGH SENSITIVITY)
Troponin I (High Sensitivity): 3 ng/L (ref <18)
Troponin I (High Sensitivity): 2 ng/L (ref <18)

## 2020-10-14 LAB — COMPREHENSIVE METABOLIC PANEL
ALT: 29 U/L (ref 0–44)
AST: 24 U/L (ref 15–41)
Albumin: 3.7 g/dL (ref 3.5–5.0)
Alkaline Phosphatase: 101 U/L (ref 38–126)
Anion gap: 9 (ref 5–15)
BUN: 7 mg/dL (ref 6–20)
CO2: 26 mmol/L (ref 22–32)
Calcium: 9 mg/dL (ref 8.9–10.3)
Chloride: 102 mmol/L (ref 98–111)
Creatinine, Ser: 0.73 mg/dL (ref 0.44–1.00)
GFR, Estimated: >60 mL/min (ref >60)
Glucose, Bld: 116 mg/dL — ABNORMAL HIGH (ref 70–99)
Potassium: 3.7 mmol/L (ref 3.5–5.1)
Sodium: 137 mmol/L (ref 135–145)
Total Bilirubin: 0.6 mg/dL (ref 0.3–1.2)
Total Protein: 7.7 g/dL (ref 6.5–8.1)

## 2020-10-14 LAB — BRAIN NATRIURETIC PEPTIDE: B Natriuretic Peptide: 29.2 pg/mL (ref 0.0–100.0)

## 2020-10-14 LAB — FIBRIN DERIVATIVES D-DIMER (ARMC ONLY): Fibrin derivatives D-dimer (ARMC): 418 ng/mL (FEU) (ref 0.00–499.00)

## 2020-10-14 MED ORDER — ALBUTEROL SULFATE HFA 108 (90 BASE) MCG/ACT IN AERS: 2.0000 | INHALATION_SPRAY | Freq: Four times a day (QID) | RESPIRATORY_TRACT | 0 refills | Status: AC | PRN

## 2020-10-14 MED ORDER — PREDNISONE 20 MG PO TABS: 20.0000 mg | ORAL_TABLET | Freq: Two times a day (BID) | ORAL | 0 refills | Status: AC

## 2020-10-14 MED ORDER — FLUTICASONE PROPIONATE 50 MCG/ACT NA SUSP: 2.0000 | Freq: Every day | NASAL | 0 refills | Status: AC

## 2020-10-14 MED ORDER — PREDNISONE 20 MG PO TABS
60.0000 mg | ORAL_TABLET | Freq: Once | ORAL | Status: AC
  Administered 2020-10-14: 60 mg via ORAL
  Filled 2020-10-14: qty 3

## 2020-10-14 MED ORDER — CETIRIZINE HCL 5 MG PO TABS: 5.0000 mg | ORAL_TABLET | Freq: Every day | ORAL | 0 refills | Status: AC

## 2020-10-14 NOTE — ED Provider Notes (Signed)
[facility] Emergency Department Provider Note ____________________________________________  Time seen: 1548  I have reviewed the triage vital signs and the nursing notes.  HISTORY  Chief Complaint  Shortness of Breath   HPI Michele Schneider is a 44 y.o. female presents herself to the ED for evaluation of what she describes as shortness of breath. She actually reports onset yesterday evening after work. She would actually describe a globus sensation to the throat and well as hoarseness. She reports heaviness in the chest with a inspiration, but denies frank chest pain. She also denies nausea, vomiting, diaphoresis, or cough. She gives report of sinus congestion and post nasal drainage. She has a history of GERD, but takes a daily PPI. She is reporting that she received her 2nd dose of the Pfizer vaccine a week ago.   Past Medical History:  Diagnosis Date  . GERD (gastroesophageal reflux disease)     There are no problems to display for this patient.   Past Surgical History:  Procedure Laterality Date  . APPENDECTOMY    . CHOLECYSTECTOMY    . TUBAL LIGATION      Prior to Admission medications   Medication Sig Start Date End Date Taking? Authorizing Provider  albuterol (VENTOLIN HFA) 108 (90 Base) MCG/ACT inhaler Inhale 2 puffs into the lungs every 6 (six) hours as needed for shortness of breath. 10/14/20   Kasara Schomer, Charlesetta Ivory, PA-C  cetirizine (ZYRTEC) 5 MG tablet Take 1 tablet (5 mg total) by mouth daily. 10/14/20 11/13/20  Jameis Newsham, Charlesetta Ivory, PA-C  fluticasone (FLONASE) 50 MCG/ACT nasal spray Place 2 sprays into both nostrils daily. 10/14/20   Johnathyn Viscomi, Charlesetta Ivory, PA-C  predniSONE (DELTASONE) 20 MG tablet Take 1 tablet (20 mg total) by mouth 2 (two) times daily with a meal for 5 days. 10/14/20 10/19/20  Chelcey Caputo, Charlesetta Ivory, PA-C    Allergies Penicillins  History reviewed. No pertinent family history.  Social History Social History    Tobacco Use  . Smoking status: Never Smoker  . Smokeless tobacco: Never Used  Substance Use Topics  . Alcohol use: Yes    Comment: social  . Drug use: Not on file    Review of Systems  Constitutional: Negative for fever. Eyes: Negative for visual changes. ENT: Positive for sore throat and hoarseness Cardiovascular: Negative for chest pain. Respiratory: Positive for shortness of breath. Gastrointestinal: Negative for abdominal pain, vomiting and diarrhea. Genitourinary: Negative for dysuria. Musculoskeletal: Negative for back pain. Skin: Negative for rash. Neurological: Negative for headaches, focal weakness or numbness. ____________________________________________  PHYSICAL EXAM:  VITAL SIGNS: ED Triage Vitals  Enc Vitals Group     BP 10/14/20 1340 (!) 174/63     Pulse Rate 10/14/20 1340 78     Resp 10/14/20 1340 20     Temp 10/14/20 1340 98.4 F (36.9 C)     Temp Source 10/14/20 1340 Oral     SpO2 10/14/20 1340 99 %     Weight 10/14/20 1342 (!) 306 lb (138.8 kg)     Height 10/14/20 1342 5\' 6"  (1.676 m)     Head Circumference --      Peak Flow --      Pain Score 10/14/20 1342 0     Pain Loc --      Pain Edu? --      Excl. in GC? --     Constitutional: Alert and oriented. Well appearing and in no distress. Head: Normocephalic and atraumatic. Eyes: Conjunctivae  are normal. PERRL. Normal extraocular movements Ears: Canals clear. TMs intact bilaterally. Nose: No congestion/rhinorrhea/epistaxis. Mouth/Throat: Mucous membranes are moist.  Uvula is midline and tonsils are flat.  No oropharyngeal lesions are appreciated. Neck: Supple. No thyromegaly. Cardiovascular: Normal rate, regular rhythm. Normal distal pulses. Respiratory: Normal respiratory effort. No wheezes/rales/rhonchi. Gastrointestinal: Soft and nontender. No distention. Musculoskeletal: Nontender with normal range of motion in all extremities.  Neurologic:  Normal gait without ataxia. Normal speech and  language. No gross focal neurologic deficits are appreciated. Skin:  Skin is warm, dry and intact. No rash noted. Psychiatric: Mood and affect are normal. Patient exhibits appropriate insight and judgment. ____________________________________________   LABS (pertinent positives/negatives) Labs Reviewed  CBC WITH DIFFERENTIAL/PLATELET - Abnormal; Notable for the following components:      Result Value   Hemoglobin 10.0 (*)    HCT 33.2 (*)    MCV 74.6 (*)    MCH 22.5 (*)    RDW 17.9 (*)    All other components within normal limits  COMPREHENSIVE METABOLIC PANEL - Abnormal; Notable for the following components:   Glucose, Bld 116 (*)    All other components within normal limits  URINALYSIS, COMPLETE (UACMP) WITH MICROSCOPIC - Abnormal; Notable for the following components:   Color, Urine YELLOW (*)    APPearance CLEAR (*)    All other components within normal limits  RESPIRATORY PANEL BY RT PCR (FLU A&B, COVID)  BRAIN NATRIURETIC PEPTIDE  FIBRIN DERIVATIVES D-DIMER ([facility] ONLY)  TROPONIN I (HIGH SENSITIVITY)  TROPONIN I (HIGH SENSITIVITY)   ____________________________________________  EKG  NSR 76 bpm PR Interval 192 ms QRS Duration 82 ms Normal axis No STEMI ____________________________________________   RADIOLOGY  CXR IMPRESSION: No acute process in the chest. ____________________________________________  PROCEDURES  Prednisone 60 mg PO  Procedures ____________________________________________  INITIAL IMPRESSION / ASSESSMENT AND PLAN / ED COURSE  Differential includes, but is not limited to, viral syndrome, bronchitis including COPD exacerbation, pneumonia, reactive airway disease including asthma, CHF including exacerbation with or without pulmonary/interstitial edema, pneumothorax, ACS, thoracic trauma, and pulmonary embolism.  Patient with ED evaluation of onset of shortness of breath, the patient describes primarily as a sensation of something hanging up in  her throat as well as some sinus drainage.  She reports symptoms that are more like congestion in the sinuses and hoarseness.  She denies a frank sore throat or fevers at this time.  She is reassured by her full work-up including normal labs; serial troponin, negative viral panel, negative D-dimer, normal BNP, no signs of elevated white count or electrolyte imbalance.  Patient's chest x-ray is also negative for any acute infectious process.  Patient will be discharged at this time with prescriptions for prednisone, inhaler, Flonase, and cetirizine.  Her PERC score is negative and her heart score is 1.  She will follow-up with her primary provider return to the ED if needed.  Michele Schneider was evaluated in Emergency Department on 10/14/2020 for the symptoms described in the history of present illness. She was evaluated in the context of the global COVID-19 pandemic, which necessitated consideration that the patient might be at risk for infection with the SARS-CoV-2 virus that causes COVID-19. Institutional protocols and algorithms that pertain to the evaluation of patients at risk for COVID-19 are in a state of rapid change based on information released by regulatory bodies including the CDC and federal and state organizations. These policies and algorithms were followed during the patient's care in the ED. ____________________________________________  FINAL CLINICAL IMPRESSION(S) /  ED DIAGNOSES  Final diagnoses:  SOB (shortness of breath)  Viral URI      Joshlyn Beadle, Charlesetta Ivory, PA-C 10/14/20 2308    Gilles Chiquito, MD 10/15/20 [phone]

## 2020-10-14 NOTE — Discharge Instructions (Signed)
Your exam, labs, chest x-ray, and EKG are all normal and reassuring at this time.  There is no indication of a serious injury to the heart or heart attack.  There is also no clinical evidence of a blood clot to the lungs.  Your test for Covid and flu is also negative at this time.  Your symptoms may represent a viral upper respiratory infection causing some hoarseness and mild shortness of breath.  Take your medications as prescribed, and follow-up with your primary provider for continued symptoms peer return to the ED if needed.

## 2020-10-14 NOTE — ED Notes (Signed)
Rainbow was drawn and sent.

## 2020-10-14 NOTE — ED Triage Notes (Signed)
Patient states shortness of breath started last night and worsened today. Patient was dyspneic with exertion walking to the exam room, but can speak in complete sentences. Patient received her 2nd Pfizer injection last week.

## 2020-10-18 ENCOUNTER — Other Ambulatory Visit: Payer: Self-pay

## 2020-10-18 ENCOUNTER — Ambulatory Visit (LOCAL_COMMUNITY_HEALTH_CENTER): Payer: Medicaid Other

## 2020-10-18 DIAGNOSIS — Z111 Encounter for screening for respiratory tuberculosis: Secondary | ICD-10-CM

## 2020-10-21 ENCOUNTER — Ambulatory Visit (LOCAL_COMMUNITY_HEALTH_CENTER): Payer: Medicaid Other

## 2020-10-21 ENCOUNTER — Other Ambulatory Visit: Payer: Self-pay

## 2020-10-21 DIAGNOSIS — Z111 Encounter for screening for respiratory tuberculosis: Secondary | ICD-10-CM

## 2020-10-21 LAB — TB SKIN TEST
Induration: 0 mm
TB Skin Test: NEGATIVE

## 2021-11-05 ENCOUNTER — Other Ambulatory Visit: Payer: Self-pay

## 2021-11-05 DIAGNOSIS — K219 Gastro-esophageal reflux disease without esophagitis: Secondary | ICD-10-CM | POA: Diagnosis not present

## 2021-11-05 DIAGNOSIS — R110 Nausea: Secondary | ICD-10-CM | POA: Diagnosis not present

## 2021-11-05 DIAGNOSIS — R1032 Left lower quadrant pain: Secondary | ICD-10-CM | POA: Diagnosis not present

## 2021-11-05 LAB — URINALYSIS, ROUTINE W REFLEX MICROSCOPIC
Bilirubin Urine: NEGATIVE
Glucose, UA: NEGATIVE mg/dL
Hgb urine dipstick: NEGATIVE
Ketones, ur: NEGATIVE mg/dL
Leukocytes,Ua: NEGATIVE
Nitrite: NEGATIVE
Protein, ur: NEGATIVE mg/dL
Specific Gravity, Urine: 1.006 (ref 1.005–1.030)
pH: 6 (ref 5.0–8.0)

## 2021-11-05 LAB — COMPREHENSIVE METABOLIC PANEL
ALT: 19 U/L (ref 0–44)
AST: 19 U/L (ref 15–41)
Albumin: 3.5 g/dL (ref 3.5–5.0)
Alkaline Phosphatase: 100 U/L (ref 38–126)
Anion gap: 4 — ABNORMAL LOW (ref 5–15)
BUN: 6 mg/dL (ref 6–20)
CO2: 27 mmol/L (ref 22–32)
Calcium: 8.7 mg/dL — ABNORMAL LOW (ref 8.9–10.3)
Chloride: 109 mmol/L (ref 98–111)
Creatinine, Ser: 0.76 mg/dL (ref 0.44–1.00)
GFR, Estimated: >60 mL/min (ref >60)
Glucose, Bld: 125 mg/dL — ABNORMAL HIGH (ref 70–99)
Potassium: 3.9 mmol/L (ref 3.5–5.1)
Sodium: 140 mmol/L (ref 135–145)
Total Bilirubin: 0.8 mg/dL (ref 0.3–1.2)
Total Protein: 7.3 g/dL (ref 6.5–8.1)

## 2021-11-05 LAB — CBC
HCT: 35.6 % — ABNORMAL LOW (ref 36.0–46.0)
Hemoglobin: 11.1 g/dL — ABNORMAL LOW (ref 12.0–15.0)
MCH: 26.1 pg (ref 26.0–34.0)
MCHC: 31.2 g/dL (ref 30.0–36.0)
MCV: 83.8 fL (ref 80.0–100.0)
Platelets: 319 10*3/uL (ref 150–400)
RBC: 4.25 MIL/uL (ref 3.87–5.11)
RDW: 14.8 % (ref 11.5–15.5)
WBC: 13.6 10*3/uL — ABNORMAL HIGH (ref 4.0–10.5)
nRBC: 0 % (ref 0.0–0.2)

## 2021-11-05 LAB — POC URINE PREG, ED: Preg Test, Ur: NEGATIVE

## 2021-11-05 LAB — LIPASE, BLOOD: Lipase: 25 U/L (ref 11–51)

## 2021-11-05 NOTE — ED Triage Notes (Addendum)
Pt been having lower left abd pain by 3 days.  States it started radiating up and down today.  She says she can feel something where it hurts.  Complains of nausea and lightheadedness. Noticed blood when she wiped yesterday.

## 2021-11-06 ENCOUNTER — Emergency Department: Payer: Medicaid Other

## 2021-11-06 ENCOUNTER — Emergency Department
Admission: EM | Admit: 2021-11-06 | Discharge: 2021-11-06 | Disposition: A | Discharge status: Home / Self Care | Payer: Medicaid Other | Attending: Emergency Medicine | Admitting: Emergency Medicine

## 2021-11-06 DIAGNOSIS — R1032 Left lower quadrant pain: Secondary | ICD-10-CM

## 2021-11-06 MED ORDER — IOHEXOL 300 MG/ML  SOLN
100.0000 mL | Freq: Once | INTRAMUSCULAR | Status: AC | PRN
  Administered 2021-11-06: 100 mL via INTRAVENOUS

## 2021-11-06 MED ORDER — AMOXICILLIN-POT CLAVULANATE 875-125 MG PO TABS: 1.0000 | ORAL_TABLET | Freq: Two times a day (BID) | ORAL | 0 refills | Status: AC

## 2021-11-06 MED ORDER — HYDROCODONE-ACETAMINOPHEN 5-325 MG PO TABS: 1.0000 | ORAL_TABLET | Freq: Four times a day (QID) | ORAL | 0 refills | Status: AC | PRN

## 2021-11-06 NOTE — ED Provider Notes (Signed)
HPI: Pt is a 45 y.o. female who presents with complaints of abdominal pain.  The patient p/w  4 days of lower abd pain. + nausea.   ROS: Denies fever, chest pain, vomiting  Past Medical History:  Diagnosis Date   GERD (gastroesophageal reflux disease)    Vitals:   11/06/21 0101 11/06/21 0410  BP: 129/69 120/61  Pulse: 86 81  Resp: 16 17  Temp: 98.2 F (36.8 C)   SpO2: 98% 100%    Focused Physical Exam: Gen: No acute distress Head: atraumatic, normocephalic Eyes: Extraocular movements grossly intact; conjunctiva clear CV: RRR Lung: No increased WOB, no stridor GI: ND, no obvious masses tender the LLQ  Neuro: Alert and awake  Medical Decision Making and Plan: Given the patient's initial medical screening exam, the following diagnostic evaluation has been ordered. The patient will be placed in the appropriate treatment space, once one is available, to complete the evaluation and treatment. I have discussed the plan of care with the patient and I have advised the patient that an ED physician or mid-level practitioner will reevaluate their condition after the test results have been received, as the results may give them additional insight into the type of treatment they may need.   Diagnostics: CT  Treatments: none immediately   Concha Se, MD 11/06/21 [phone]

## 2021-11-06 NOTE — ED Provider Notes (Signed)
[facility] Emergency Department Provider Note   ____________________________________________    I have reviewed the triage vital signs and the nursing notes.   HISTORY  Chief Complaint Abdominal Pain (Abd pain by 3 days)     HPI Michele Schneider is a 45 y.o. female who presents with complaints of lower abdominal pain and mild nausea which is been ongoing over the last 4 days and has been somewhat intermittent.  She denies fevers or chills.  No dysuria.  No vaginal discharge.  Has a history of appendectomy and cholecystectomy and tubal ligation.  No sick contacts reported.  No recent travel.  No new medications   Past Medical History:  Diagnosis Date   GERD (gastroesophageal reflux disease)     There are no problems to display for this patient.   Past Surgical History:  Procedure Laterality Date   APPENDECTOMY     CHOLECYSTECTOMY     TUBAL LIGATION      Prior to Admission medications   Medication Sig Start Date End Date Taking? Authorizing Provider  amoxicillin-clavulanate (AUGMENTIN) 875-125 MG tablet Take 1 tablet by mouth 2 (two) times daily for 7 days. 11/06/21 11/13/21 Yes Jene Every, MD  HYDROcodone-acetaminophen (NORCO/VICODIN) 5-325 MG tablet Take 1 tablet by mouth every 6 (six) hours as needed for severe pain. 11/06/21  Yes Jene Every, MD  albuterol (VENTOLIN HFA) 108 (90 Base) MCG/ACT inhaler Inhale 2 puffs into the lungs every 6 (six) hours as needed for shortness of breath. 10/14/20   Menshew, Charlesetta Ivory, PA-C  cetirizine (ZYRTEC) 5 MG tablet Take 1 tablet (5 mg total) by mouth daily. 10/14/20 11/13/20  Menshew, Charlesetta Ivory, PA-C  fluticasone (FLONASE) 50 MCG/ACT nasal spray Place 2 sprays into both nostrils daily. 10/14/20   Menshew, Charlesetta Ivory, PA-C     Allergies Penicillin g and Penicillins  No family history on file.  Social History Social History   Tobacco Use   Smoking status: Never   Smokeless  tobacco: Never  Substance Use Topics   Alcohol use: Yes    Comment: social    Review of Systems  Constitutional: No fever/chills Eyes: No visual changes.  ENT: No sore throat. Cardiovascular: Denies chest pain. Respiratory: Denies shortness of breath. Gastrointestinal: As above Genitourinary: As above Musculoskeletal: Negative for back pain. Skin: Negative for rash. Neurological: Negative for headaches or weakness   ____________________________________________   PHYSICAL EXAM:  VITAL SIGNS: ED Triage Vitals  Enc Vitals Group     BP 11/05/21 2130 130/83     Pulse Rate 11/05/21 2130 100     Resp 11/05/21 2130 18     Temp 11/05/21 2130 98.1 F (36.7 C)     Temp Source 11/05/21 2130 Oral     SpO2 11/05/21 2130 99 %     Weight 11/05/21 2128 (!) 140.2 kg (309 lb)     Height 11/05/21 2128 1.676 m (5\' 6" )     Head Circumference --      Peak Flow --      Pain Score 11/05/21 2128 10     Pain Loc --      Pain Edu? --      Excl. in GC? --     Constitutional: Alert and oriented.  Eyes: Conjunctivae are normal.   Nose: No congestion/rhinnorhea. Mouth/Throat: Mucous membranes are moist.    Cardiovascular: Normal rate, regular rhythm. Grossly normal heart sounds.  Good peripheral circulation. Respiratory: Normal respiratory effort.  No retractions.  Lungs CTAB. Gastrointestinal: Soft, mild tenderness palpation left lower quadrant right lower quadrant, no CVA, overall reassuring exam  Musculoskeletal:   Warm and well perfused Neurologic:  Normal speech and language. No gross focal neurologic deficits are appreciated.  Skin:  Skin is warm, dry and intact. No rash noted. Psychiatric: Mood and affect are normal. Speech and behavior are normal.  ____________________________________________   LABS (all labs ordered are listed, but only abnormal results are displayed)  Labs Reviewed  COMPREHENSIVE METABOLIC PANEL - Abnormal; Notable for the following components:      Result  Value   Glucose, Bld 125 (*)    Calcium 8.7 (*)    Anion gap 4 (*)    All other components within normal limits  CBC - Abnormal; Notable for the following components:   WBC 13.6 (*)    Hemoglobin 11.1 (*)    HCT 35.6 (*)    All other components within normal limits  URINALYSIS, ROUTINE W REFLEX MICROSCOPIC - Abnormal; Notable for the following components:   Color, Urine YELLOW (*)    APPearance CLEAR (*)    All other components within normal limits  LIPASE, BLOOD  POC URINE PREG, ED   ____________________________________________  EKG  ED ECG REPORT I, Lavonia Drafts, the attending physician, personally viewed and interpreted this ECG.  Date: 11/06/2021  Rhythm: normal sinus rhythm QRS Axis: normal Intervals: normal ST/T Wave abnormalities: normal Narrative Interpretation: no evidence of acute ischemia  ____________________________________________  RADIOLOGY  CT abdomen pelvis reviewed by me, no acute abnormality ____________________________________________   PROCEDURES  Procedure(s) performed: No  Procedures   Critical Care performed: No ____________________________________________   INITIAL IMPRESSION / ASSESSMENT AND PLAN / ED COURSE  Pertinent labs & imaging results that were available during my care of the patient were reviewed by me and considered in my medical decision making (see chart for details).   Patient overall well-appearing and in no acute distress, presents with vague lower abdominal pain as discussed above.  She does have a mildly elevated white blood cell count, chemistries are overall reassuring, normal lipase.  Mild tenderness left lower quadrant suggests possible diverticulitis versus colitis, sent for CT abdomen pelvis  CT is overall quite reassuring, suspect early diverticulitis will DC with analgesics and Augmentin    ____________________________________________   FINAL CLINICAL IMPRESSION(S) / ED DIAGNOSES  Final diagnoses:   Left lower quadrant abdominal pain        Note:  This document was prepared using Dragon voice recognition software and may include unintentional dictation errors.    Lavonia Drafts, MD 11/06/21 [phone]

## 2021-12-31 IMAGING — CT CT ABD-PELV W/ CM
2 of 5 series · 16 of 46 positions shown, 18 images · IV contrast (omnipaque)
Comparison: CT the abdomen and pelvis 09/10/2018.

CLINICAL DATA: 44-year-old female with history of left lower
abdominal pain for the past 3 days and abdominal distension.

EXAM:
CT ABDOMEN AND PELVIS WITH CONTRAST
TECHNIQUE: Multidetector CT imaging of the abdomen and pelvis was performed
using the standard protocol following bolus administration of
intravenous contrast.
CONTRAST:  100mL OMNIPAQUE IOHEXOL 300 MG/ML  SOLN

[Series 2: routine abd/pel with · axial · 0.98mm/px · z∈[-976,-496]mm · 13 of 108 slices shown, 15 images]
[im 6/108  soft-tissue]
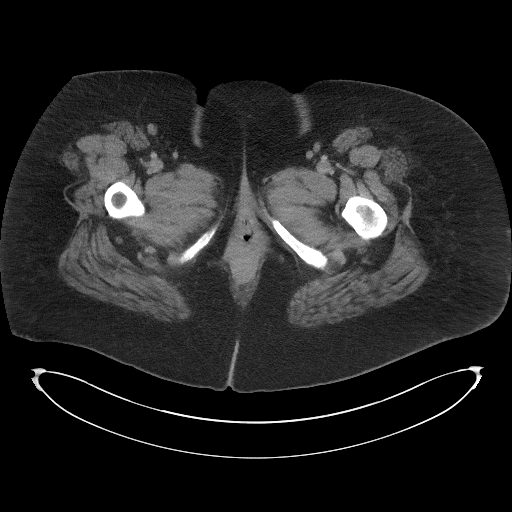
[im 6/108  bone]
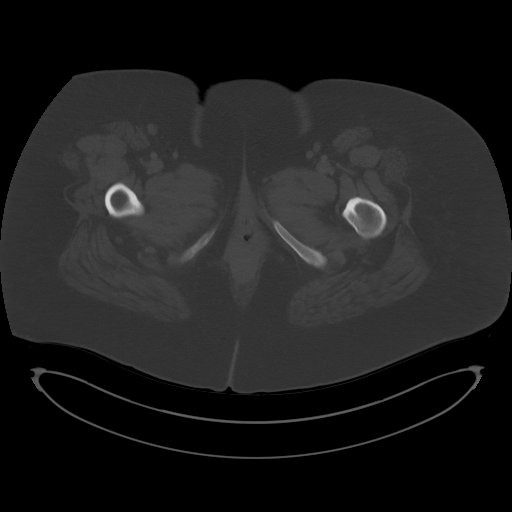
[im 12/108  soft-tissue]
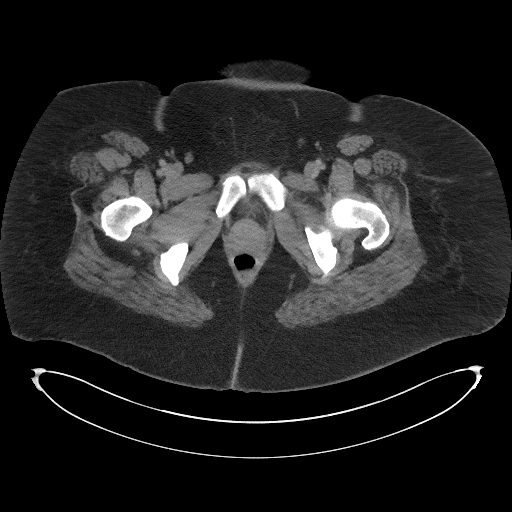
[im 24/108  soft-tissue]
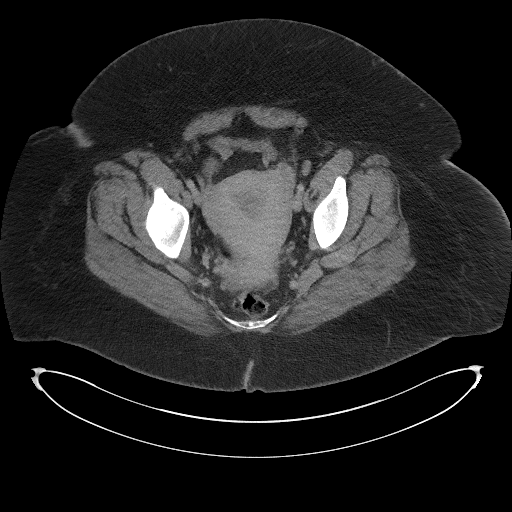
[im 30/108  soft-tissue]
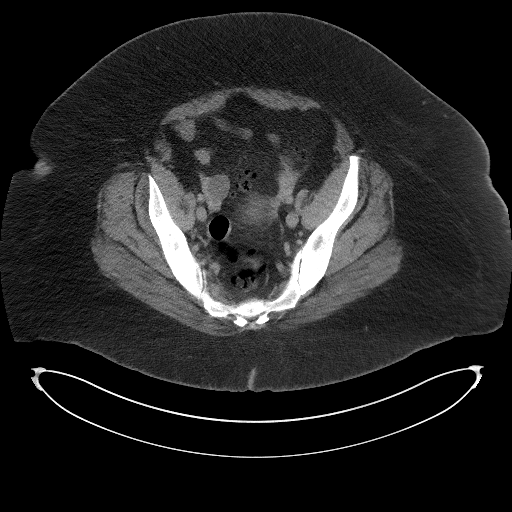
[im 36/108  soft-tissue]
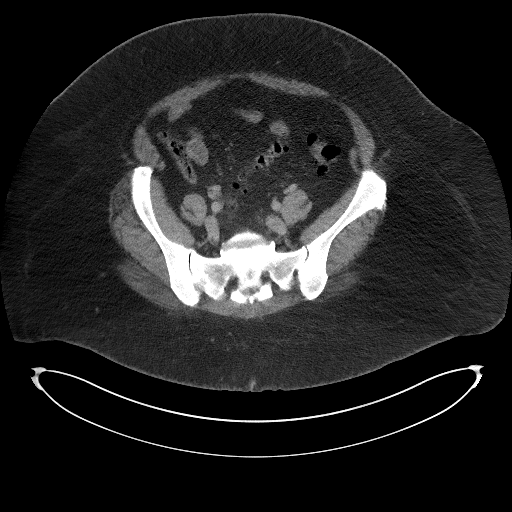
[im 48/108  soft-tissue]
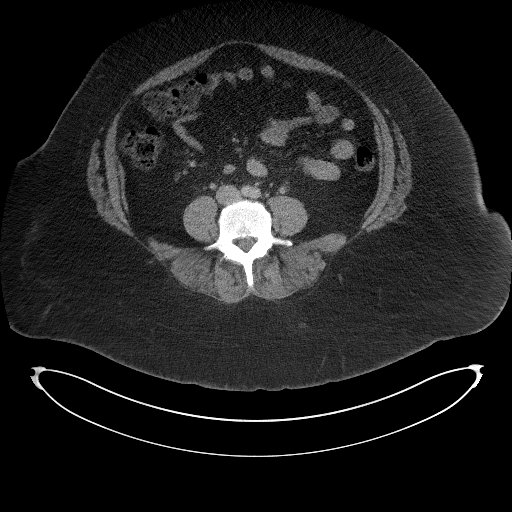
[im 54/108  soft-tissue]
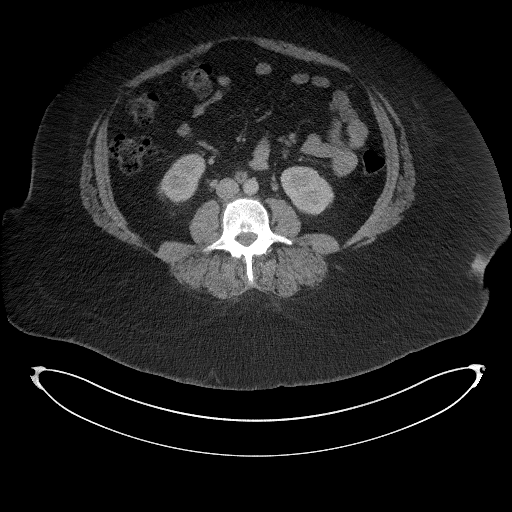
[im 60/108  soft-tissue]
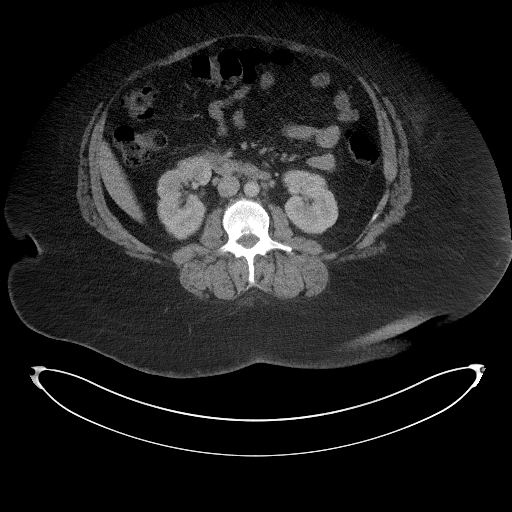
[im 72/108  soft-tissue]
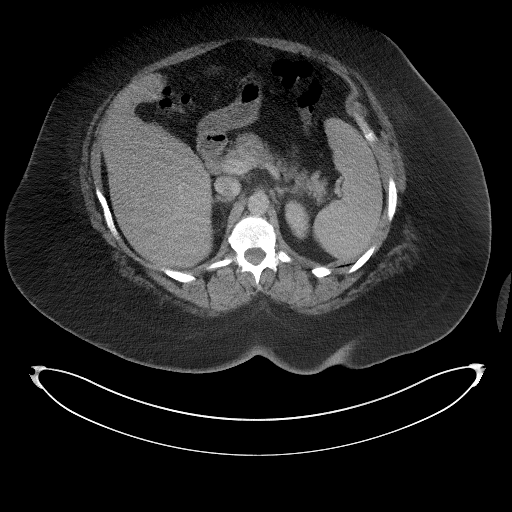
[im 72/108  bone]
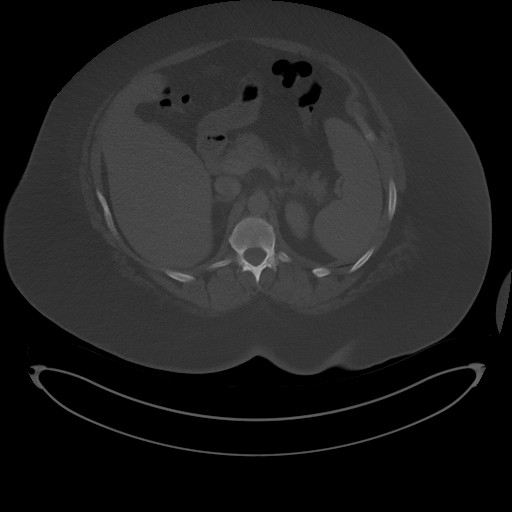
[im 78/108  soft-tissue]
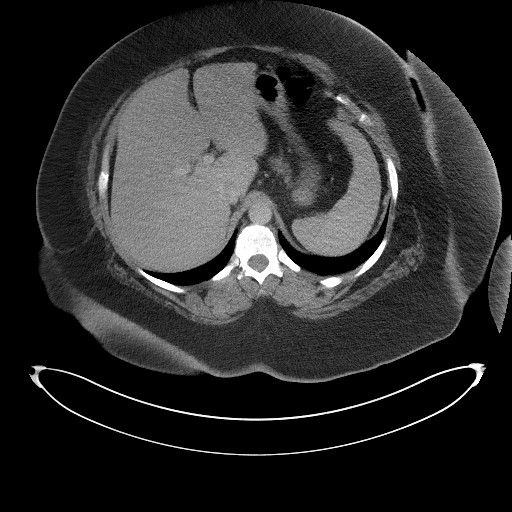
[im 84/108  soft-tissue]
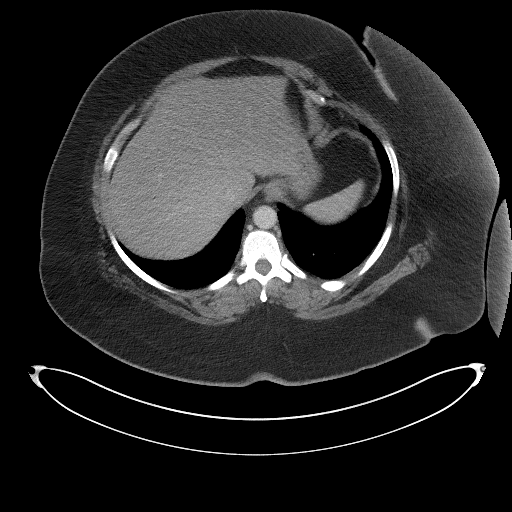
[im 96/108  soft-tissue]
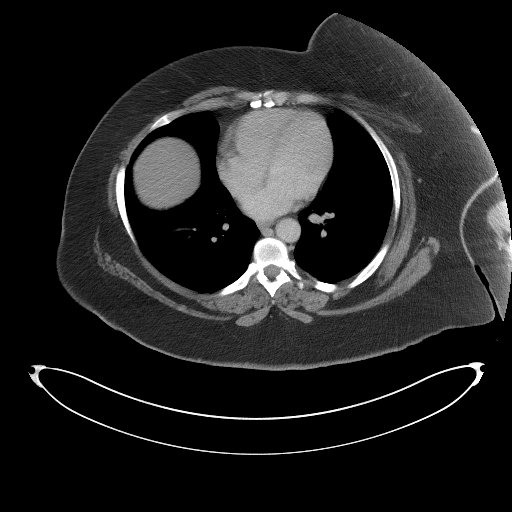
[im 102/108  soft-tissue]
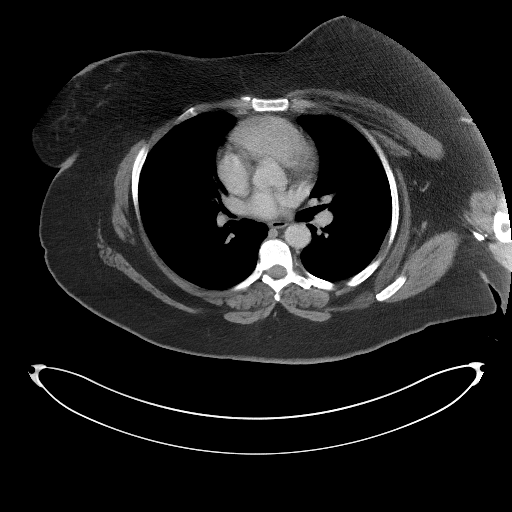

[Series 5: coronal st · coronal · 1.00mm/px · 3 of 114 slices shown]
[im 38/114  soft-tissue]
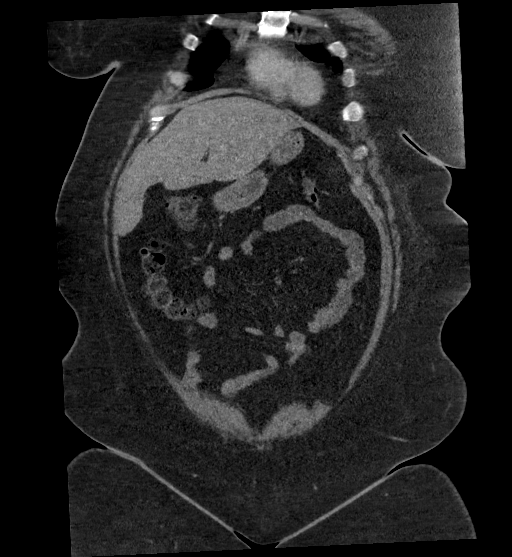
[im 51/114  soft-tissue]
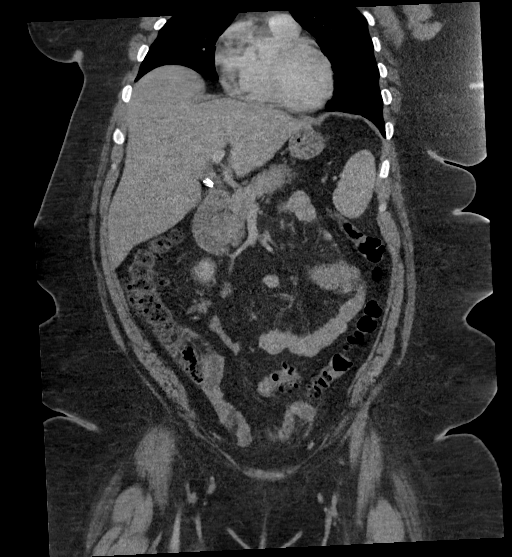
[im 63/114  soft-tissue]
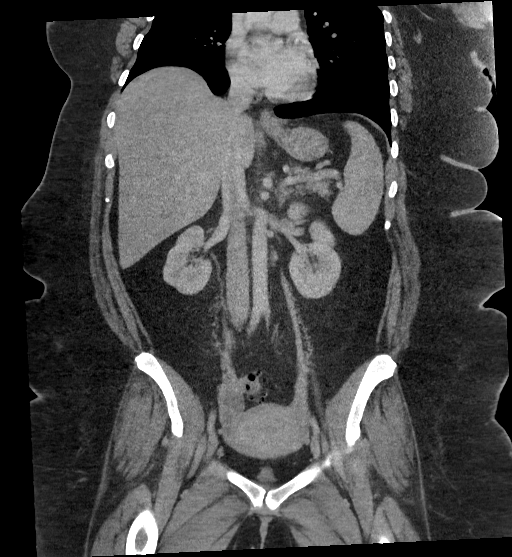

[16 of 46 positions shown; findings below may reference images not displayed]

FINDINGS: Lower chest: Unremarkable.

Hepatobiliary: No suspicious cystic or solid hepatic lesions. Mild
diffuse low attenuation throughout the hepatic parenchyma,
indicative of hepatic steatosis. No intra or extrahepatic biliary
ductal dilatation. Status post cholecystectomy.

Pancreas: No pancreatic mass. No pancreatic ductal dilatation. No
pancreatic or peripancreatic fluid collections or inflammatory
changes.

Spleen: Unremarkable.

Adrenals/Urinary Tract: Bilateral kidneys and bilateral adrenal
glands are normal in appearance. No hydroureteronephrosis. Urinary
bladder is normal in appearance.

Stomach/Bowel: The appearance of the stomach is normal. No
pathologic dilatation of small bowel or colon. Several scattered
colonic diverticulae are noted, without definite focal surrounding
inflammatory changes to suggest an acute diverticulitis at this
time. Status post appendectomy.

Vascular/Lymphatic: No significant atherosclerotic disease, aneurysm
or dissection noted in the abdominal or pelvic vasculature. No
lymphadenopathy noted in the abdomen or pelvis.

Reproductive: Uterus and ovaries are unremarkable in appearance.

Other: No significant volume of ascites.  No pneumoperitoneum.

Musculoskeletal: There are no aggressive appearing lytic or blastic
lesions noted in the visualized portions of the skeleton.
IMPRESSION: 1. No acute findings are noted in the abdomen or pelvis to account
for the patient's symptoms.
2. Colonic diverticulosis without evidence of acute diverticulitis.
3. Hepatic steatosis.
4. Status post appendectomy.

## 2022-07-24 ENCOUNTER — Ambulatory Visit: Payer: Self-pay | Admitting: Physician Assistant

## 2024-02-04 ENCOUNTER — Other Ambulatory Visit: Payer: 59

## 2024-03-17 ENCOUNTER — Emergency Department

## 2024-03-17 ENCOUNTER — Emergency Department
Admission: EM | Admit: 2024-03-17 | Discharge: 2024-03-17 | Disposition: A | Discharge status: Other Acute Inpatient Hospital | Attending: Emergency Medicine | Admitting: Emergency Medicine

## 2024-03-17 DIAGNOSIS — R109 Unspecified abdominal pain: Secondary | ICD-10-CM

## 2024-03-17 DIAGNOSIS — K6289 Other specified diseases of anus and rectum: Secondary | ICD-10-CM | POA: Insufficient documentation

## 2024-03-17 DIAGNOSIS — R112 Nausea with vomiting, unspecified: Secondary | ICD-10-CM | POA: Insufficient documentation

## 2024-03-17 DIAGNOSIS — R1084 Generalized abdominal pain: Secondary | ICD-10-CM | POA: Diagnosis present

## 2024-03-17 LAB — POC URINE PREG, ED: Preg Test, Ur: NEGATIVE

## 2024-03-17 LAB — CBC WITH DIFFERENTIAL/PLATELET
Abs Immature Granulocytes: 0.05 10*3/uL (ref 0.00–0.07)
Basophils Absolute: 0 10*3/uL (ref 0.0–0.1)
Basophils Relative: 0 %
Eosinophils Absolute: 0.1 10*3/uL (ref 0.0–0.5)
Eosinophils Relative: 1 %
HCT: 31.9 % — ABNORMAL LOW (ref 36.0–46.0)
Hemoglobin: 9.7 g/dL — ABNORMAL LOW (ref 12.0–15.0)
Immature Granulocytes: 1 %
Lymphocytes Relative: 9 %
Lymphs Abs: 0.9 10*3/uL (ref 0.7–4.0)
MCH: 25.1 pg — ABNORMAL LOW (ref 26.0–34.0)
MCHC: 30.4 g/dL (ref 30.0–36.0)
MCV: 82.6 fL (ref 80.0–100.0)
Monocytes Absolute: 0.5 10*3/uL (ref 0.1–1.0)
Monocytes Relative: 5 %
Neutro Abs: 8.4 10*3/uL — ABNORMAL HIGH (ref 1.7–7.7)
Neutrophils Relative %: 84 %
Platelets: 345 10*3/uL (ref 150–400)
RBC: 3.86 MIL/uL — ABNORMAL LOW (ref 3.87–5.11)
RDW: 14.6 % (ref 11.5–15.5)
WBC: 10 10*3/uL (ref 4.0–10.5)
nRBC: 0 % (ref 0.0–0.2)

## 2024-03-17 LAB — URINALYSIS, ROUTINE W REFLEX MICROSCOPIC
Bilirubin Urine: NEGATIVE
Glucose, UA: NEGATIVE mg/dL
Hgb urine dipstick: NEGATIVE
Ketones, ur: NEGATIVE mg/dL
Leukocytes,Ua: NEGATIVE
Nitrite: NEGATIVE
Protein, ur: NEGATIVE mg/dL
Specific Gravity, Urine: 1.013 (ref 1.005–1.030)
pH: 8 (ref 5.0–8.0)

## 2024-03-17 LAB — COMPREHENSIVE METABOLIC PANEL
ALT: 17 U/L (ref 0–44)
AST: 27 U/L (ref 15–41)
Albumin: 3.7 g/dL (ref 3.5–5.0)
Alkaline Phosphatase: 80 U/L (ref 38–126)
Anion gap: 8 (ref 5–15)
BUN: 9 mg/dL (ref 6–20)
CO2: 22 mmol/L (ref 22–32)
Calcium: 9 mg/dL (ref 8.9–10.3)
Chloride: 107 mmol/L (ref 98–111)
Creatinine, Ser: 0.6 mg/dL (ref 0.44–1.00)
GFR, Estimated: >60 mL/min (ref >60)
Glucose, Bld: 102 mg/dL — ABNORMAL HIGH (ref 70–99)
Potassium: 3.5 mmol/L (ref 3.5–5.1)
Sodium: 137 mmol/L (ref 135–145)
Total Bilirubin: 0.6 mg/dL (ref 0.0–1.2)
Total Protein: 7.3 g/dL (ref 6.5–8.1)

## 2024-03-17 LAB — LACTIC ACID, PLASMA: Lactic Acid, Venous: 1.4 mmol/L (ref 0.5–1.9)

## 2024-03-17 MED ORDER — HYDROMORPHONE HCL 1 MG/ML IJ SOLN
0.5000 mg | Freq: Once | INTRAMUSCULAR | Status: DC
  Filled 2024-03-17: qty 0.5

## 2024-03-17 MED ORDER — HYDROMORPHONE HCL 1 MG/ML IJ SOLN
0.5000 mg | Freq: Once | INTRAMUSCULAR | Status: AC
  Administered 2024-03-17: 0.5 mg via INTRAVENOUS
  Filled 2024-03-17: qty 0.5

## 2024-03-17 MED ORDER — ONDANSETRON HCL 4 MG/2ML IJ SOLN
4.0000 mg | Freq: Once | INTRAMUSCULAR | Status: AC
  Administered 2024-03-17: 4 mg via INTRAVENOUS
  Filled 2024-03-17: qty 2

## 2024-03-17 MED ORDER — MORPHINE SULFATE (PF) 4 MG/ML IV SOLN
4.0000 mg | Freq: Once | INTRAVENOUS | Status: AC
  Administered 2024-03-17: 4 mg via INTRAVENOUS
  Filled 2024-03-17: qty 1

## 2024-03-17 MED ORDER — IOHEXOL 300 MG/ML  SOLN
100.0000 mL | Freq: Once | INTRAMUSCULAR | Status: AC | PRN
  Administered 2024-03-17: 100 mL via INTRAVENOUS

## 2024-03-17 NOTE — ED Provider Notes (Signed)
 [facility] Emergency Department Provider Note     Event Date/Time   First MD Initiated Contact with Patient 03/17/24 1108     (approximate)   History   Abdominal Pain   HPI  Michele Schneider is a 48 y.o. female with a PMH of GERD, perforated sigmoid diverticulitis s/p hartman's procedure 02/18/24.  presents to the ED for evaluation of sudden intermittent generalized abdominal pain with an onset of this morning. Associated symptoms include nausea and vomiting. She also complains of rectal pain. LBM was yesterday and it was not normal per the patient. She states it was harder and has mucous present. No hemoptysis, rectal bleeding or fever.    Physical Exam   Triage Vital Signs: ED Triage Vitals  Encounter Vitals Group     BP 03/17/24 1057 (!) 112/101     Systolic BP Percentile --      Diastolic BP Percentile --      Pulse Rate 03/17/24 1057 78     Resp 03/17/24 1057 18     Temp 03/17/24 1057 97.7 F (36.5 C)     Temp Source 03/17/24 1057 Oral     SpO2 03/17/24 1057 100 %     Weight --      Height 03/17/24 1058 5\' 6"  (1.676 m)     Head Circumference --      Peak Flow --      Pain Score 03/17/24 1058 10     Pain Loc --      Pain Education --      Exclude from Growth Chart --     Most recent vital signs: Vitals:   03/17/24 1400 03/17/24 1703  BP:  115/81  Pulse: 80 82  Resp: 16 15  Temp:    SpO2: 100% 100%    General Awake, no distress.  HEENT NCAT.  CV:  Good peripheral perfusion.  RESP:  Normal effort.  ABD:  No distention. Soft. Tenderness out of proportion to all 4 quadrants, more prominent in right upper quadrant. Dressing in place over left of umbilical. Skin is flaky and dry over incision site however appropriate healing. Surrounding erythema noted to the top of the incision site and induration can be palpated.    ED Results / Procedures / Treatments   Labs (all labs ordered are listed, but only abnormal results are  displayed) Labs Reviewed  URINALYSIS, ROUTINE W REFLEX MICROSCOPIC - Abnormal; Notable for the following components:      Result Value   Color, Urine YELLOW (*)    APPearance CLEAR (*)    All other components within normal limits  COMPREHENSIVE METABOLIC PANEL - Abnormal; Notable for the following components:   Glucose, Bld 102 (*)    All other components within normal limits  CBC WITH DIFFERENTIAL/PLATELET - Abnormal; Notable for the following components:   RBC 3.86 (*)    Hemoglobin 9.7 (*)    HCT 31.9 (*)    MCH 25.1 (*)    Neutro Abs 8.4 (*)    All other components within normal limits  LACTIC ACID, PLASMA  POC URINE PREG, ED   RADIOLOGY  I personally viewed and evaluated these images as part of my medical decision making, as well as reviewing the written report by the radiologist.  ED Provider Interpretation: CT reveals midline surgical scar with fluid pockets. Will confirm with final radiology read.   CT ABDOMEN PELVIS W CONTRAST Result Date: 03/17/2024 CLINICAL DATA:  Abdominal pain, acute,  nonlocalized Abdominal pain, post-op EXAM: CT ABDOMEN AND PELVIS WITH CONTRAST TECHNIQUE: Multidetector CT imaging of the abdomen and pelvis was performed using the standard protocol following bolus administration of intravenous contrast. RADIATION DOSE REDUCTION: This exam was performed according to the departmental dose-optimization program which includes automated exposure control, adjustment of the mA and/or kV according to patient size and/or use of iterative reconstruction technique. CONTRAST:  OMNIPAQUE IOHEXOL 300 MG/ML  SOLN COMPARISON:  CT scan abdomen and pelvis from 11/06/2021. FINDINGS: Lower chest: There are patchy atelectatic changes in the visualized lung bases. No overt consolidation. No pleural effusion. The heart is normal in size. No pericardial effusion. Hepatobiliary: The liver is normal in size. Non-cirrhotic configuration. No suspicious mass. No intrahepatic or  extrahepatic bile duct dilation. Gallbladder is surgically absent. Pancreas: Unremarkable. No pancreatic ductal dilatation or surrounding inflammatory changes. Spleen: Within normal limits. No focal lesion. Adrenals/Urinary Tract: Adrenal glands are unremarkable. No suspicious renal mass. There is a partially exophytic subcentimeter sized simple cyst arising from the right kidney interpolar region. No hydronephrosis. No renal or ureteric calculi. Unremarkable urinary bladder. Stomach/Bowel: No disproportionate dilation of the small or large bowel loops. Rectosigmoid colocolonic anastomosis noted in the posterior pelvis. There is mild circumferential fat stranding surrounding the distal descending colon just proximal to the anastomosis. Rest of the colon is otherwise unremarkable and exhibits few diverticula without diverticulitis. Vascular/Lymphatic: There is fat stranding anterior/superior to the uterus. 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 There is midline anterior abdominal wall scar and several non walled-off fluid collections in the scar with largest in the epigastric region measuring up to 3.4 x 3.4 cm. There is focal soft tissue defect in the left paramedian mid abdominal wall (series 2, image 55), suggesting recent surgery. No pneumoperitoneum. The differential diagnosis for fat stranding in the pelvis includes postsurgical changes from presumed colonic resection/reanastomosis versus tubo-ovarian inflammation. Correlate with surgical history. No abdominal or pelvic lymphadenopathy, by size criteria. No aneurysmal dilation of the major abdominal arteries. Reproductive: Normal-size anteverted uterus. No discrete focal mass seen. Fullness in the region of bilateral adnexa, as discussed above. Other: Midline surgical scar noted containing multiple areas of fluid collections. Please see  above for details. Musculoskeletal: No suspicious osseous lesions. There are mild multilevel degenerative changes in the visualized spine. IMPRESSION: 1. 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 There is midline anterior abdominal wall scar and several non walled-off fluid collections in the scar with largest in the epigastric region measuring up to 3.4 x 3.4 cm. The differential diagnosis for fat stranding in the pelvis includes postsurgical changes from presumed colonic resection/reanastomosis versus tubo-ovarian inflammation. Correlate clinically and with surgical history. 2. Multiple other nonacute observations, as described above. Electronically Signed   By: Jules Schick M.D.   On: 03/17/2024 15:56    PROCEDURES:  Critical Care performed: No  Procedures   MEDICATIONS ORDERED IN ED: Medications  morphine (PF) 4 MG/ML injection 4 mg (4 mg Intravenous Given 03/17/24 1134)  ondansetron (ZOFRAN) injection 4 mg (4 mg Intravenous Given 03/17/24 1134)  morphine (PF) 4 MG/ML injection 4 mg (4 mg Intravenous Given 03/17/24 1213)  iohexol (OMNIPAQUE) 300 MG/ML solution 100 mL (100 mLs Intravenous Contrast Given 03/17/24 1403)  HYDROmorphone (DILAUDID) injection 0.5 mg (0.5 mg Intravenous Given 03/17/24 1706)  IMPRESSION / MDM / ASSESSMENT AND PLAN / ED COURSE  I reviewed the triage vital signs and the nursing notes.                              Clinical Course as of 03/17/24 1848  Mon Mar 17, 2024  1209 Urinalysis, Routine w reflex microscopic -Urine, Clean Catch(!) Normal [MH]  1210 CBC with Differential(!) No leukocytosis, 9.7 hgb - consistent with trend [MH]  1210 Rectal pain has resolved. DRE performed. No hemorrhoids. No stool burden. Pt complaining of severe pain of right periumbilical region. Past surgical hx of cholecystectomy and  appendectomy. Will order another morphine while awaiting CT A/P [MH]  1248 Pain is not controlled with morphine. 10/10 will order Dilaudid  [MH]  1402 On reassement patient is comfortable. She reports pain has subsided with the second dose of morphine. She states if she remains in one position pain is tolerable. Will hold off on dilaudid for now.  [MH]  1634 Patient now complains of lower pelvic pain and has stated the pain from her upper quadrant has moved to pelvic region. Requesting more pain medication - will give dilaudid. Will obtain US [MH]    Clinical Course User Index [MH] Conrad [place], PA-C    48 y.o. female presents to the emergency department for evaluation and treatment of acute severe abdominal pain. See HPI for further details.   Differential diagnosis includes, but is not limited to diverticulitis, small bowel obstruction, constipation, Post-op complication, henria  Considering SBO as most likley differential given history of surgery on 02/24. Patient was able to pass a bowel movement while in ED and is able pass gas. Will obtain CTA.  Extensive chart review. Seen at PCP for hospital f/u for severe abdominal pain on 03/10/24 .PCP ordered CT A/P, however patient reports INS could not cover this image unless as an emergency. Pt did not have CT performed. Hx of perforated diverticulitis requiring partial colectomy. Elective hartman reversal performed on 02/18/24.  she was admitted at [facility] from 02/18/24-02/25/24. Since her hospitalization ongoing severe intermittent generalized abdominal pain.   Patient's presentation is most consistent with acute complicated illness / injury requiring diagnostic workup.  ----------------------------------------- 6:49 PM on 03/17/2024 ----------------------------------------- Transferring care to Dr. Cyril Loosen. Awaiting [place] results and appropriate disposition.    FINAL CLINICAL IMPRESSION(S) / ED DIAGNOSES   Final diagnoses:  None    Rx / DC  Orders   ED Discharge Orders     None       Note:  This document was prepared using Dragon voice recognition software and may include unintentional dictation errors.    Romeo Apple, Quandre Polinski A, PA-C 03/17/24 1850    Jene Every, MD 03/17/24 2133    Jene Every, MD 03/17/24 2136

## 2024-03-17 NOTE — ED Notes (Signed)
 Called to [facility] Per MD Kinner @843PM Norlene Campbell to Transfer/Images Powershared & Facesheet Faxed/Rep Silva Bandy.

## 2024-03-17 NOTE — ED Notes (Signed)
 At patients request, called patients daughter with completed updated about condition and transfer to [facility].

## 2024-03-17 NOTE — ED Triage Notes (Signed)
 First nurse note: Pt here via [facility] from [facility] (unknown name by EMS of Group home) on [address] [place]. Pt had colostomy reversal on 2/24 with intermittent pain, pt in severe pain today. Pt had BM yesterday but not a lot but states she didn't feel like she went fully.   142/75 HR: 86 96% RA

## 2024-03-17 NOTE — ED Notes (Signed)
 ..  EMTALA: REQUIRED DOCUMENTATION COMPLETED AND REVIEWED BY WRITER PRIOR TO PT TRANSFER MD REASSESSMENT EMTALA RN SECTION TRANSFER E-SIGN VS WITHIN REQUIRED TIME

## 2024-03-17 NOTE — ED Notes (Signed)
 Called to [facility] for pt transport to [facility]/Pt on waitlist per Rep Selena Batten.

## 2024-03-17 NOTE — ED Triage Notes (Signed)
 Refer to nurse note. Pt reports generalized abdominal pain that started this morning. Reports nausea and vomiting.

## 2024-03-17 NOTE — ED Notes (Signed)
 Pt has been accepted to [facility] 5 Bed Tower Room 5306/Report [phone] opt 2/Accepting Toma Deiters Charrice

## 2024-06-10 ENCOUNTER — Other Ambulatory Visit

## 2024-06-12 ENCOUNTER — Other Ambulatory Visit
